# Patient Record
Sex: Male | Born: 1970 | Race: Black or African American | Hispanic: No | Marital: Married | Smoking: Never smoker
Health system: Southern US, Community
[De-identification: ages and names within clinical notes are randomized; demographics above are authoritative.]

## PROBLEM LIST (undated history)

## (undated) DIAGNOSIS — B009 Herpesviral infection, unspecified: Secondary | ICD-10-CM

## (undated) DIAGNOSIS — T7840XA Allergy, unspecified, initial encounter: Secondary | ICD-10-CM

## (undated) DIAGNOSIS — L280 Lichen simplex chronicus: Secondary | ICD-10-CM

## (undated) DIAGNOSIS — G40909 Epilepsy, unspecified, not intractable, without status epilepticus: Secondary | ICD-10-CM

## (undated) HISTORY — DX: Epilepsy, unspecified, not intractable, without status epilepticus: G40.909

## (undated) HISTORY — PX: CYSTECTOMY: SUR359

## (undated) HISTORY — PX: TONSILLECTOMY: SUR1361

## (undated) HISTORY — DX: Herpesviral infection, unspecified: B00.9

## (undated) HISTORY — DX: Allergy, unspecified, initial encounter: T78.40XA

## (undated) HISTORY — DX: Lichen simplex chronicus: L28.0

---

## 1998-04-11 ENCOUNTER — Emergency Department (HOSPITAL_COMMUNITY): Admission: EM | Admit: 1998-04-11 | Discharge: 1998-04-11 | Payer: Self-pay | Admitting: Emergency Medicine

## 2000-03-07 ENCOUNTER — Emergency Department (HOSPITAL_COMMUNITY): Admission: EM | Admit: 2000-03-07 | Discharge: 2000-03-07 | Payer: Self-pay | Admitting: Emergency Medicine

## 2000-07-05 ENCOUNTER — Encounter: Payer: Self-pay | Admitting: Emergency Medicine

## 2000-07-05 ENCOUNTER — Emergency Department (HOSPITAL_COMMUNITY): Admission: EM | Admit: 2000-07-05 | Discharge: 2000-07-05 | Payer: Self-pay | Admitting: *Deleted

## 2000-07-05 ENCOUNTER — Emergency Department (HOSPITAL_COMMUNITY): Admission: EM | Admit: 2000-07-05 | Discharge: 2000-07-05 | Payer: Self-pay | Admitting: Emergency Medicine

## 2009-04-14 ENCOUNTER — Ambulatory Visit: Payer: Self-pay | Admitting: Diagnostic Radiology

## 2009-04-14 ENCOUNTER — Ambulatory Visit (HOSPITAL_BASED_OUTPATIENT_CLINIC_OR_DEPARTMENT_OTHER): Admission: RE | Admit: 2009-04-14 | Discharge: 2009-04-14 | Payer: Self-pay | Admitting: Family Medicine

## 2009-09-07 ENCOUNTER — Ambulatory Visit: Payer: Self-pay | Admitting: Internal Medicine

## 2009-09-07 DIAGNOSIS — A6000 Herpesviral infection of urogenital system, unspecified: Secondary | ICD-10-CM | POA: Insufficient documentation

## 2009-09-07 DIAGNOSIS — R569 Unspecified convulsions: Secondary | ICD-10-CM

## 2009-09-07 LAB — CONVERTED CEMR LAB
Basophils Absolute: 0.1 10*3/uL (ref 0.0–0.1)
Basophils Relative: 1.9 % (ref 0.0–3.0)
Eosinophils Absolute: 0.1 10*3/uL (ref 0.0–0.7)
Eosinophils Relative: 1.9 % (ref 0.0–5.0)
HCT: 41.9 % (ref 39.0–52.0)
Hemoglobin: 14.3 g/dL (ref 13.0–17.0)
Herpes Simplex Vrs I&II-IgM Ab (EIA): 0.85
Lymphocytes Relative: 23.6 % (ref 12.0–46.0)
Lymphs Abs: 1.7 10*3/uL (ref 0.7–4.0)
MCHC: 34.1 g/dL (ref 30.0–36.0)
MCV: 90.4 fL (ref 78.0–100.0)
Monocytes Absolute: 0.5 10*3/uL (ref 0.1–1.0)
Monocytes Relative: 7 % (ref 3.0–12.0)
Neutro Abs: 4.7 10*3/uL (ref 1.4–7.7)
Neutrophils Relative %: 65.6 % (ref 43.0–77.0)
Platelets: 203 10*3/uL (ref 150.0–400.0)
RBC: 4.63 M/uL (ref 4.22–5.81)
RDW: 11.7 % (ref 11.5–14.6)
Sed Rate: 7 mm/hr (ref 0–22)
WBC: 7.1 10*3/uL (ref 4.5–10.5)

## 2009-09-08 ENCOUNTER — Telehealth: Payer: Self-pay | Admitting: Internal Medicine

## 2009-09-08 ENCOUNTER — Ambulatory Visit: Payer: Self-pay | Admitting: Internal Medicine

## 2009-09-22 ENCOUNTER — Ambulatory Visit: Payer: Self-pay | Admitting: Internal Medicine

## 2009-10-13 ENCOUNTER — Ambulatory Visit: Payer: Self-pay | Admitting: Internal Medicine

## 2009-10-23 ENCOUNTER — Encounter: Payer: Self-pay | Admitting: Internal Medicine

## 2010-01-11 ENCOUNTER — Ambulatory Visit: Payer: Self-pay | Admitting: Internal Medicine

## 2010-01-11 ENCOUNTER — Telehealth: Payer: Self-pay | Admitting: Internal Medicine

## 2010-01-11 DIAGNOSIS — E663 Overweight: Secondary | ICD-10-CM | POA: Insufficient documentation

## 2010-02-13 ENCOUNTER — Encounter: Payer: Self-pay | Admitting: Internal Medicine

## 2010-09-05 ENCOUNTER — Encounter: Payer: Self-pay | Admitting: Internal Medicine

## 2010-09-12 IMAGING — CR DG FOOT COMPLETE 3+V*R*
3 series · 3 of 3 positions shown · non-contrast
Comparison: None

CLINICAL DATA: Right great toe pain.  No injury

RIGHT FOOT COMPLETE - 3+ VIEW

[t foot ap right]
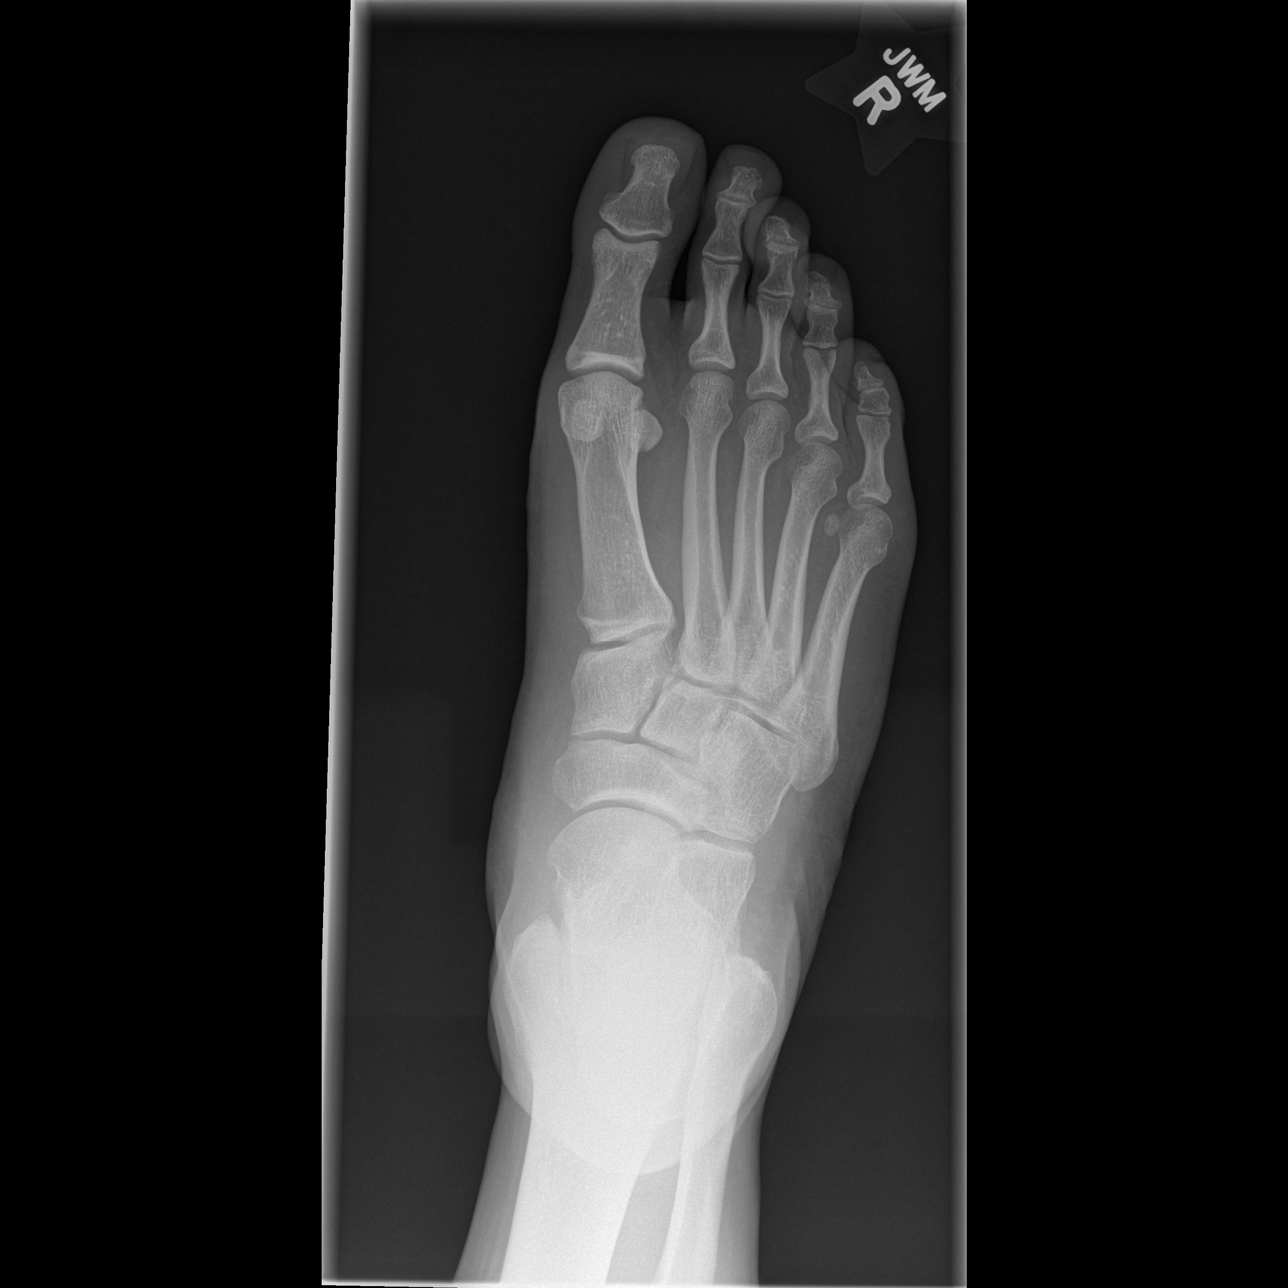

[t foot oblique right]
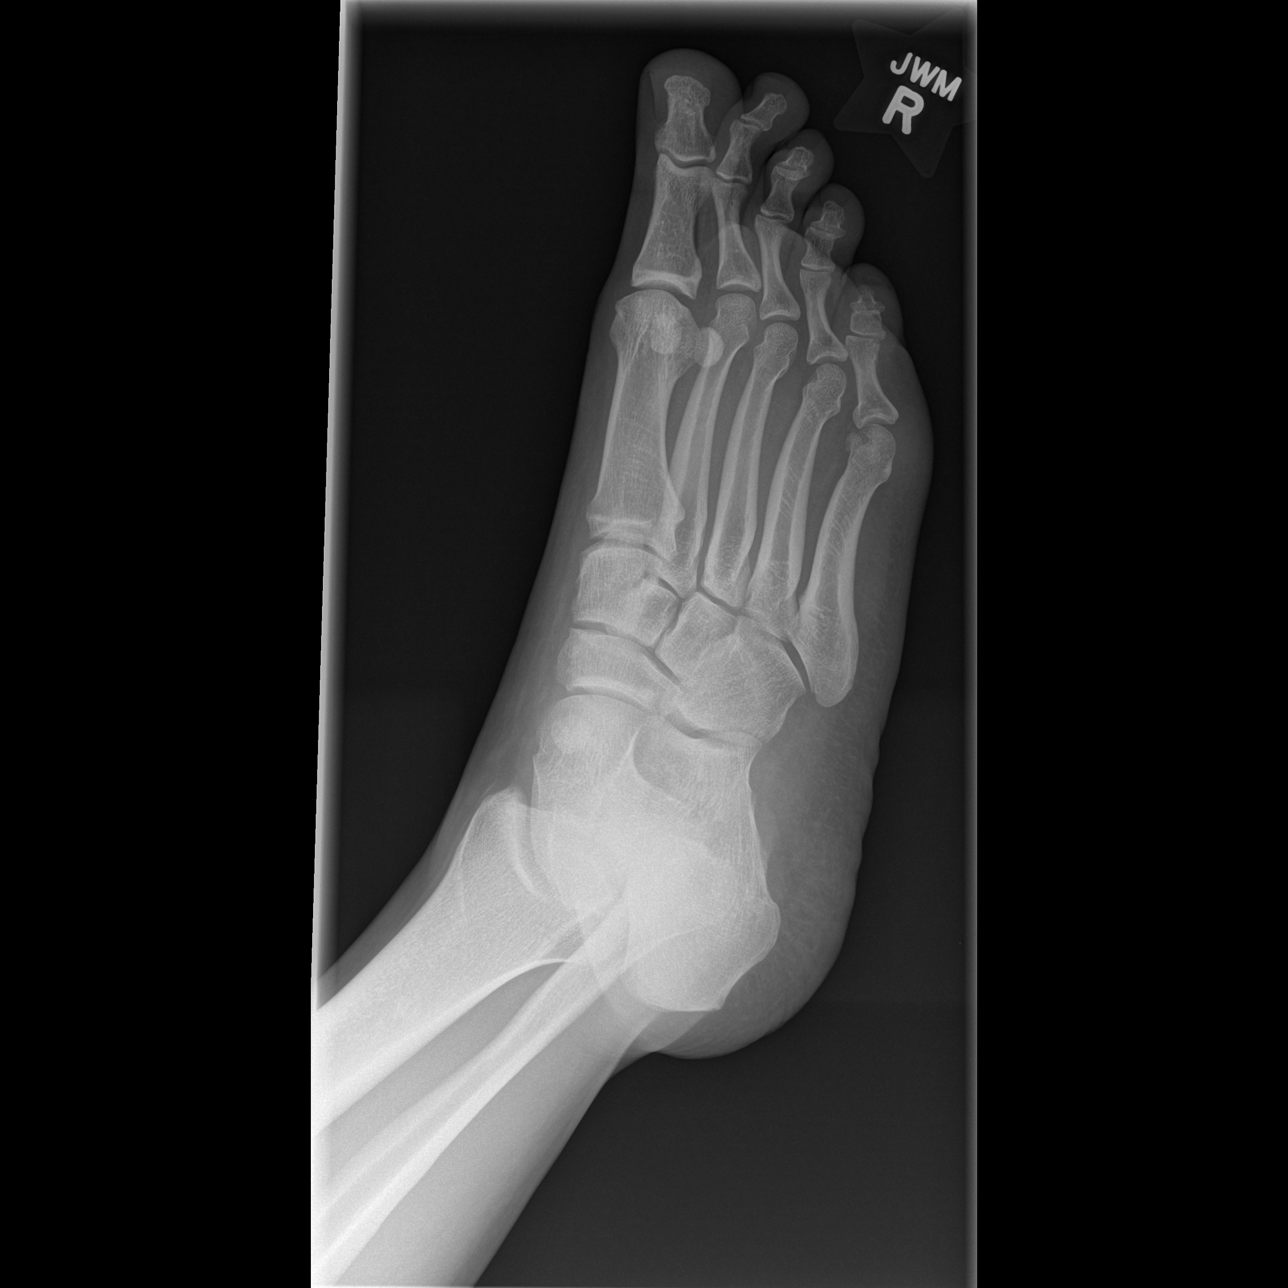

[t foot lat right]
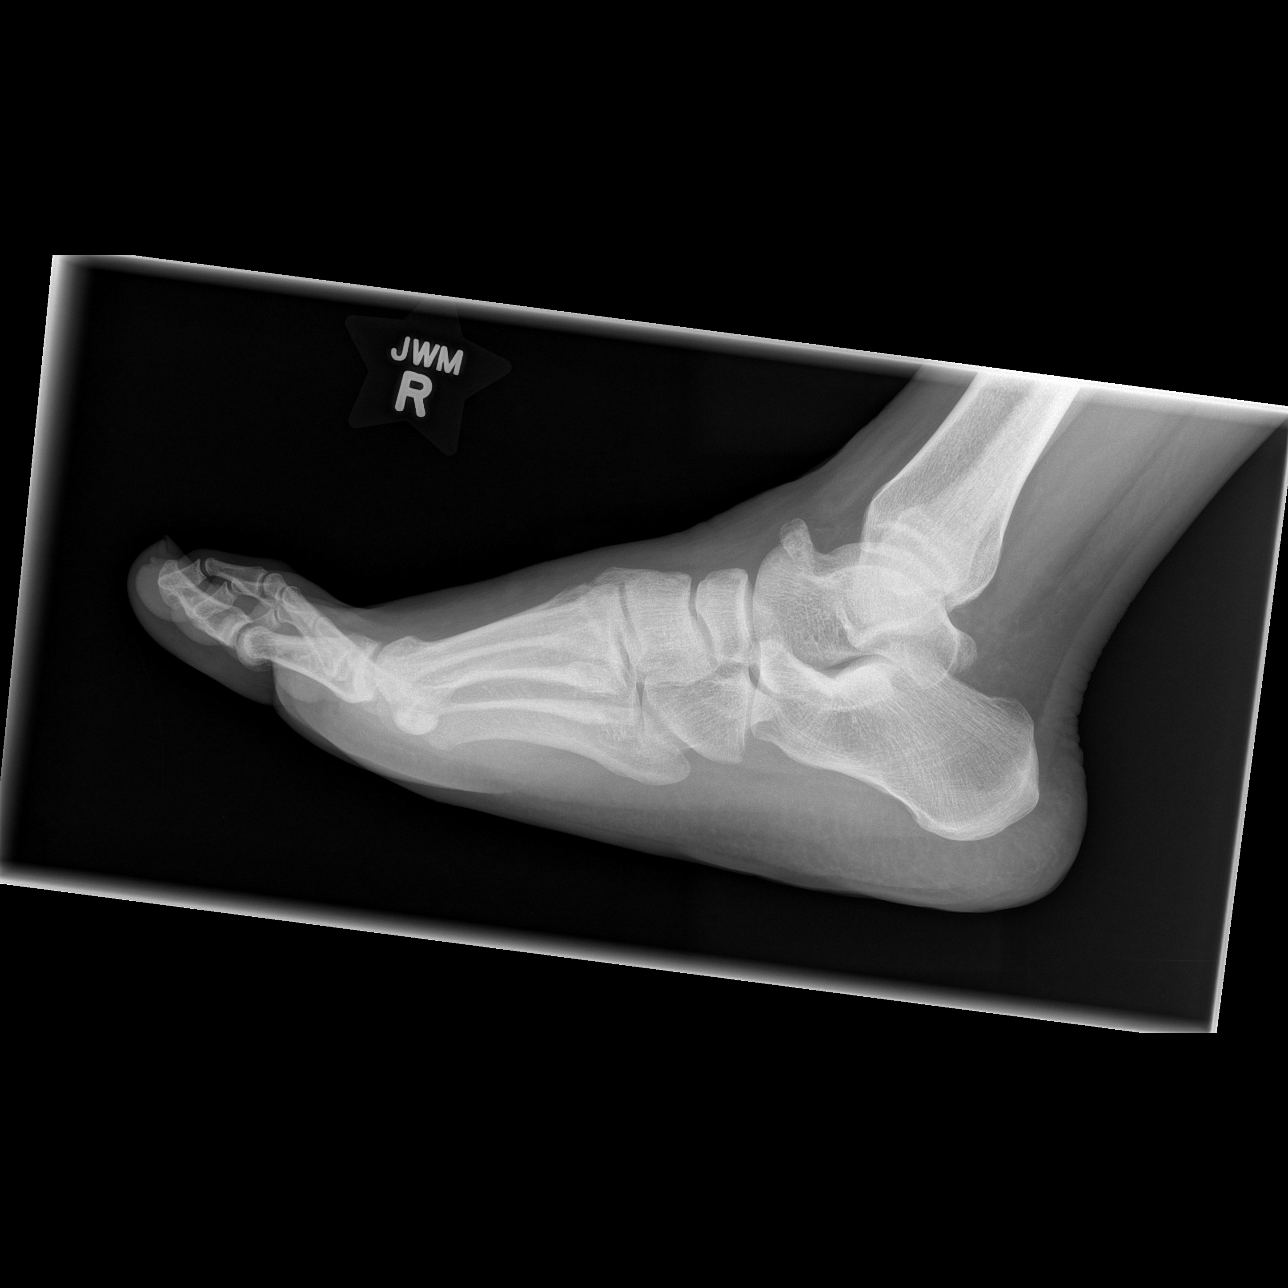

[3 of 3 positions shown; findings below may reference images not displayed]

FINDINGS: No evidence of fracture or dislocation of the first ray.
No soft tissue abnormality.  There is congenital beaking of the
anterior talus.
IMPRESSION: No acute osseous abnormality of the great toe or first ray.

## 2010-12-12 ENCOUNTER — Ambulatory Visit: Payer: Self-pay | Admitting: Internal Medicine

## 2010-12-12 LAB — CONVERTED CEMR LAB
ALT: 28 units/L (ref 0–53)
AST: 43 units/L — ABNORMAL HIGH (ref 0–37)
Albumin: 4.2 g/dL (ref 3.5–5.2)
Alkaline Phosphatase: 29 units/L — ABNORMAL LOW (ref 39–117)
BUN: 35 mg/dL — ABNORMAL HIGH (ref 6–23)
Basophils Absolute: 0 10*3/uL (ref 0.0–0.1)
Basophils Relative: 0.4 % (ref 0.0–3.0)
Bilirubin, Direct: 0.1 mg/dL (ref 0.0–0.3)
CO2: 30 meq/L (ref 19–32)
Calcium: 9.4 mg/dL (ref 8.4–10.5)
Chloride: 102 meq/L (ref 96–112)
Cholesterol: 268 mg/dL — ABNORMAL HIGH (ref 0–200)
Creatinine, Ser: 1.5 mg/dL (ref 0.4–1.5)
Direct LDL: 201.6 mg/dL
Eosinophils Absolute: 0.1 10*3/uL (ref 0.0–0.7)
Eosinophils Relative: 2.2 % (ref 0.0–5.0)
GFR calc non Af Amer: 68.12 mL/min (ref >60.00)
Glucose, Bld: 89 mg/dL (ref 70–99)
HCT: 41.8 % (ref 39.0–52.0)
HDL: 48.8 mg/dL (ref >39.00)
Hemoglobin: 14.2 g/dL (ref 13.0–17.0)
Lymphocytes Relative: 29.9 % (ref 12.0–46.0)
Lymphs Abs: 1.6 10*3/uL (ref 0.7–4.0)
MCHC: 34 g/dL (ref 30.0–36.0)
MCV: 92 fL (ref 78.0–100.0)
Monocytes Absolute: 0.4 10*3/uL (ref 0.1–1.0)
Monocytes Relative: 6.7 % (ref 3.0–12.0)
Neutro Abs: 3.4 10*3/uL (ref 1.4–7.7)
Neutrophils Relative %: 60.8 % (ref 43.0–77.0)
Platelets: 207 10*3/uL (ref 150.0–400.0)
Potassium: 4.7 meq/L (ref 3.5–5.1)
RBC: 4.55 M/uL (ref 4.22–5.81)
RDW: 12.8 % (ref 11.5–14.6)
Sodium: 138 meq/L (ref 135–145)
TSH: 0.49 microintl units/mL (ref 0.35–5.50)
Total Bilirubin: 0.7 mg/dL (ref 0.3–1.2)
Total CHOL/HDL Ratio: 5
Total Protein: 7 g/dL (ref 6.0–8.3)
Triglycerides: 35 mg/dL (ref 0.0–149.0)
VLDL: 7 mg/dL (ref 0.0–40.0)
WBC: 5.5 10*3/uL (ref 4.5–10.5)

## 2011-01-31 NOTE — Assessment & Plan Note (Signed)
Summary: FU Ricky Soto   Vital Signs:  Patient profile:   40 year old male Height:      71 inches Weight:      240 pounds O2 Sat:      98 % on Room air Temp:     98.1 degrees F oral Pulse rate:   57 / minute Pulse rhythm:   regular BP sitting:   130 / 84  (left arm) Cuff size:   large  Vitals Entered By: Rock Nephew CMA (January 11, 2010 9:48 AM)  O2 Flow:  Room air CC: follow-up visit   Primary Care Provider:  Yetta Barre  CC:  follow-up visit.  History of Present Illness: He returns for f/up and is having monthly outbreaks in his rectum with burning and stinging. Anamantle has helped some. He  has used up all of his refills of Acyclovir.  Preventive Screening-Counseling & Management  Alcohol-Tobacco     Alcohol drinks/day: <1     Smoking Status: never  Hep-HIV-STD-Contraception     Hepatitis Risk: no risk noted     HIV Risk: no risk noted     STD Risk: no risk noted      Sexual History:  currently monogamous.        Drug Use:  no.        Blood Transfusions:  no.    Current Medications (verified): 1)  Keppra 500 Mg Tabs (Levetiracetam) .... Take 1 Tablet By Mouth Two Times A Day 2)  Lidocaine-Hydrocortisone Ace 3-2.5 % Kit (Lidocaine-Hydrocortisone Ace)  Allergies (verified): 1)  ! Penicillin  Past History:  Past Medical History: Reviewed history from 09/07/2009 and no changes required. Seizure disorder  Past Surgical History: Reviewed history from 09/07/2009 and no changes required. Tonsillectomy  Family History: Reviewed history from 09/07/2009 and no changes required. Family History of Arthritis Family History Hypertension  Social History: Reviewed history from 09/07/2009 and no changes required. Occupation: Designer, fashion/clothing Married Never Smoked Alcohol use-yes Drug use-no Regular exercise-yes  Review of Systems       The patient complains of weight gain.  The patient denies anorexia, fever, weight loss, chest pain, abdominal pain, suspicious skin  lesions, enlarged lymph nodes, and angioedema.   General:  Denies chills, fatigue, fever, loss of appetite, malaise, sweats, and weakness.  Physical Exam  General:  alert, well-developed, well-nourished, well-hydrated, appropriate dress, normal appearance, healthy-appearing, cooperative to examination, and good hygiene.   Eyes:  No icterus Mouth:  Oral mucosa and oropharynx without lesions or exudates.  Teeth in good repair. Neck:  supple, full ROM, no masses, no thyromegaly, normal carotid upstroke, and no cervical lymphadenopathy.   Lungs:  Normal respiratory effort, chest expands symmetrically. Lungs are clear to auscultation, no crackles or wheezes. Heart:  Normal rate and regular rhythm. S1 and S2 normal without gallop, murmur, click, rub or other extra sounds. Rectal:  normal sphincter tone, no masses, no tenderness, no fissures, no fistulae, no perianal rash, and external hemorrhoid(s).  no more lesions on the mucosa.  Genitalia:  circumcised, no hydrocele, no varicocele, no scrotal masses, no testicular masses or atrophy, no cutaneous lesions, and no urethral discharge.   Msk:  normal ROM, no joint tenderness, no joint swelling, and no joint warmth.   Extremities:  No clubbing, cyanosis, edema, or deformity noted with normal full range of motion of all joints.   Neurologic:  No cranial nerve deficits noted. Station and gait are normal. Plantar reflexes are down-going bilaterally. DTRs are symmetrical throughout. Sensory, motor  and coordinative functions appear intact.   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  Complete Medication List: 1)  Keppra 500 Mg Tabs (Levetiracetam) .... Take 1 tablet by mouth two times a day 2)  Lidocaine-hydrocortisone Ace 3-2.5 % Kit (Lidocaine-hydrocortisone ace) 3)  Acyclovir 400 Mg Tabs (Acyclovir) .... One by mouth two  times a day  Patient Instructions: 1)  Please schedule a follow-up appointment in 4 months. 2)  It is important that you exercise regularly at least 20 minutes 5 times a week. If you develop chest pain, have severe difficulty breathing, or feel very tired , stop exercising immediately and seek medical attention. 3)  You need to lose weight. Consider a lower calorie diet and regular exercise.  Prescriptions: ACYCLOVIR 400 MG TABS (ACYCLOVIR) One by mouth two times a day  #60 x 11   Entered and Authorized by:   Etta Grandchild MD   Signed by:   Etta Grandchild MD on 01/11/2010   Method used:   Electronically to        [facility] [phone]* (retail)       [address]       [place], [place]  [address]       Ph: [phone]       Fax: [phone]   RxID:   [id number]

## 2011-01-31 NOTE — Letter (Signed)
Summary: [facility]   Imported By: Lester Humeston 10/01/2010 09:13:46  _____________________________________________________________________  External Attachment:    Type:   Image     Comment:   External Document

## 2011-01-31 NOTE — Assessment & Plan Note (Signed)
Summary: CPX/[employer]/will come fasting/cd   Vital Signs:  Patient profile:   40 year old male Height:      71 inches Weight:      234 pounds BMI:     32.75 O2 Sat:      96 % on Room air Temp:     97.6 degrees F oral Pulse rate:   60 / minute Pulse rhythm:   regular Resp:     16 per minute BP sitting:   106 / 70  (left arm) Cuff size:   large  Vitals Entered By: Rock Nephew CMA (December 12, 2010 8:11 AM)  Nutrition Counseling: Patient's BMI is greater than 25 and therefore counseled on weight management options.  O2 Flow:  Room air CC: Pt here for CPX w/labs Is Patient Diabetic? No Pain Assessment Patient in pain? no       Does patient need assistance? Functional Status Self care Ambulation Normal   Primary Care Provider:  Yetta Barre  CC:  Pt here for CPX w/labs.  History of Present Illness: He returns for a complete physical but also asks that his dose of Acyclovir be increased. He still has an occasional outbreak in his anal area and is going to have a biopsy done by Dr. Andrey Campanile whenever he can schedule it. He has patches of dry skin over his penis tip and on his buttocks.  Current Medications (verified): 1)  Keppra 500 Mg Tabs (Levetiracetam) .... Take 1 Tablet By Mouth Two Times A Day 2)  Acyclovir 400 Mg Tabs (Acyclovir) .... One By Mouth Two Times A Day  Allergies (verified): 1)  ! 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 Family History of Arthritis Family History Hypertension  Social History: Reviewed history from 09/07/2009 and no changes required. 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   General:  Denies chills, fatigue, fever, loss of appetite, malaise, sleep disorder, sweats, weakness, and weight loss. GU:  Denies decreased libido, discharge, dysuria, genital sores, hematuria, incontinence, nocturia, urinary frequency, and urinary hesitancy. Neuro:  Denies brief paralysis, difficulty with concentration, disturbances in coordination, headaches, memory loss, numbness, poor balance, seizures, sensation of room spinning, tingling, tremors, visual disturbances, and weakness.  Physical Exam  General:  alert, well-developed, well-nourished, well-hydrated, appropriate dress, normal appearance, healthy-appearing, cooperative to examination, good hygiene, and overweight-appearing.   Head:  normocephalic, atraumatic, no abnormalities observed, and no abnormalities palpated.   Eyes:  vision grossly intact, pupils equal, and no injection.   Ears:  External ear exam shows no significant lesions or deformities.  Otoscopic examination reveals clear canals, tympanic membranes are intact bilaterally without bulging, retraction, inflammation or discharge. Hearing is grossly normal bilaterally. Nose:  External nasal examination shows no deformity or inflammation. Nasal mucosa are pink and moist without lesions or exudates.  Mouth:  Oral mucosa and oropharynx without lesions or exudates.  Teeth in good  repair. Neck:  supple, full ROM, no masses, no thyromegaly, no thyroid nodules or tenderness, no JVD, normal carotid upstroke, no carotid bruits, no cervical lymphadenopathy, and no neck tenderness.   Lungs:  normal respiratory effort, no intercostal retractions, no accessory muscle use, normal breath sounds, no dullness, no fremitus, no crackles, and no wheezes.   Heart:  normal rate, regular rhythm, no murmur, no gallop, no rub, and no JVD.   Abdomen:  soft, non-tender, normal bowel sounds, no distention, no masses, no guarding, no rigidity, no rebound tenderness, no abdominal hernia, no inguinal hernia, no hepatomegaly, and no splenomegaly.   Rectal:  normal sphincter tone, no masses, no tenderness, no fissures, no fistulae, no perianal rash, and external hemorrhoid(s).  no more lesions on the mucosa.  Genitalia:  circumcised, no hydrocele, no varicocele, no scrotal masses, no testicular masses or atrophy, and no urethral discharge.   Prostate:  Prostate gland firm and smooth, no enlargement, nodularity, tenderness, mass, asymmetry or induration. Msk:  normal ROM, no joint tenderness, no joint swelling, and no joint warmth.   Pulses:  R and L carotid,radial,femoral,dorsalis pedis and posterior tibial pulses are full and equal bilaterally Extremities:  No clubbing, cyanosis, edema, or deformity noted with normal full range of motion of all joints.   Neurologic:  No cranial nerve deficits noted. Station and gait are normal. Plantar reflexes are down-going bilaterally. DTRs are symmetrical throughout. Sensory, motor and coordinative functions appear intact. Skin:  he has areas of xerosis on his glans and buttocks with scaling but there are no vesicles, excoriations, erythema, exudate, induration, ttp, streaking, or fissuring. Cervical Nodes:  no anterior cervical adenopathy and no posterior cervical adenopathy.   Axillary Nodes:  no R axillary adenopathy and no L axillary adenopathy.   Inguinal Nodes:   no R inguinal adenopathy and no L inguinal adenopathy.   Psych:  Oriented X3, memory intact for recent and remote, normally interactive, good eye contact, not anxious appearing, not depressed appearing, not agitated, not suicidal, and not homicidal.     Impression & Recommendations:  Problem # 1:  ROUTINE GENERAL MEDICAL EXAM@HEALTH  CARE FACL (ICD-V70.0) Assessment New  Td Booster: given (12/30/2008)    Discussed using sunscreen, use of alcohol, drug use, self testicular exam, routine dental care, routine eye care, routine physical exam, seat belts, multiple vitamins, and recommendations for immunizations.  Discussed exercise and checking cholesterol.  Discussed gun safety, safe sex, and contraception.   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 Take 1 tablet by mouth two times a day  Orders: Venipuncture ([address]) TLB-Lipid Panel (80061-LIPID) TLB-BMP (Basic Metabolic Panel-BMET) (80048-METABOL) TLB-CBC Platelet - w/Differential (85025-CBCD) TLB-Hepatic/Liver Function Pnl (80076-HEPATIC) TLB-TSH (Thyroid Stimulating Hormone) (84443-TSH)  Complete Medication List: 1)  Keppra 500 Mg Tabs (Levetiracetam) .... Take 1 tablet by mouth two times a day 2)  Acyclovir 800 Mg Tabs (Acyclovir) .... One by mouth two times a day  Patient Instructions: 1)  Please schedule a follow-up appointment in 2 months. 2)  It  is important that you exercise regularly  at least 20 minutes 5 times a week. If you develop chest pain, have severe difficulty breathing, or feel very tired , stop exercising immediately and seek medical attention. 3)  You need to lose weight. Consider a lower calorie diet and regular exercise.  4)  If you could be exposed to sexually transmitted diseases, you should use a condom. Prescriptions: ACYCLOVIR 800 MG TABS (ACYCLOVIR) One by mouth two times a day  #60 x 11   Entered and Authorized by:   Etta Grandchild MD   Signed by:   Etta Grandchild MD on 12/12/2010   Method used:   Electronically to        [id number] (retail)       [address]       [place], [place]  [address]       Ph: [phone] or [phone]       Fax: [phone]   RxID:   [id number] ACYCLOVIR 800 MG TABS (ACYCLOVIR) One by mouth two times a day  #60 x 11   Entered and Authorized by:   Etta Grandchild MD   Signed by:   Etta Grandchild MD on 12/12/2010   Method used:   Electronically to        [facility] [phone]* (retail)       [address]       [place], [place]  [address]       Ph: [phone]       Fax: [phone]   RxID:   [id number]    Orders Added: 1)  Venipuncture [93235] 2)  TLB-Lipid Panel [80061-LIPID] 3)  TLB-BMP (Basic Metabolic Panel-BMET) [80048-METABOL] 4)  TLB-CBC Platelet - w/Differential [85025-CBCD] 5)  TLB-Hepatic/Liver Function Pnl [80076-HEPATIC] 6)  TLB-TSH (Thyroid Stimulating Hormone) [84443-TSH] 7)  Est. Patient 18-39 years [[address]] 8)  Est. Patient Level IV [[address]]

## 2011-01-31 NOTE — Letter (Signed)
Summary: [facility]   Imported By: Sherian Rein 03/12/2010 09:43:07  _____________________________________________________________________  External Attachment:    Type:   Image     Comment:   External Document

## 2011-01-31 NOTE — Progress Notes (Signed)
  Phone Note Outgoing Call   Summary of Call: done Initial call taken by: Etta Grandchild MD,  January 11, 2010 10:13 AM  New Problems: OVERWEIGHT (ICD-278.02)   New Problems: OVERWEIGHT (ICD-278.02)

## 2011-01-31 NOTE — Letter (Signed)
Summary: Lipid Letter  [facility]  [address] [place], [place] [address]   Phone: [phone]  Fax: [phone]    12/12/2010  Ricky Soto [address] [place], [place]  [address]  Dear Ricky Soto:  We have carefully reviewed your last lipid profile from  and the results are noted below with a summary of recommendations for lipid management.    Cholesterol:       268     Goal: <200 wow, high   HDL "good" Cholesterol:   57.84     Goal: >40   LDL "bad" Cholesterol:   202     Goal: <130 wow, high   Triglycerides:       35.0     Goal: <150        TLC Diet (Therapeutic Lifestyle Change): Saturated Fats & Transfatty acids should be kept < 7% of total calories ***Reduce Saturated Fats Polyunstaurated Fat can be up to 10% of total calories Monounsaturated Fat Fat can be up to 20% of total calories Total Fat should be no greater than 25-35% of total calories Carbohydrates should be 50-60% of total calories Protein should be approximately 15% of total calories Fiber should be at least 20-30 grams a day ***Increased fiber may help lower LDL Total Cholesterol should be < 200mg /day Consider adding plant stanol/sterols to diet (example: Benacol spread) ***A higher intake of unsaturated fat may reduce Triglycerides and Increase HDL    Adjunctive Measures (may lower LIPIDS and reduce risk of Heart Attack) include: Aerobic Exercise (20-30 minutes 3-4 times a week) Limit Alcohol Consumption Weight Reduction Aspirin 75-81 mg a day by mouth (if not allergic or contraindicated) Dietary Fiber 20-30 grams a day by mouth     Current Medications: 1)    Keppra 500 Mg Tabs (Levetiracetam) .... Take 1 tablet by mouth two times a day 2)    Acyclovir 800 Mg Tabs (Acyclovir) .... One by mouth two times a day  If you have any questions, please call. We appreciate being able to work with you.   Sincerely,    [facility] Ricky Grandchild MD

## 2011-01-31 NOTE — Letter (Signed)
Summary: Results Follow-up Letter  [facility]  [address] [place], [place] [address]   Phone: [phone]  Fax: [phone]    12/12/2010  Erskin Burnet [address] [place], [place]  [address]  Dear Mr. [facility],   The following are the results of your recent test(s):  Test     Result     Kidney     mild dehydration and dysfunction Liver       slight enzyme elevation CBC       normal Thyroid     normal   _________________________________________________________  Please call for an appointment soon _________________________________________________________ _________________________________________________________ _________________________________________________________  Sincerely,  Sanda Linger MD [facility]

## 2011-07-12 ENCOUNTER — Ambulatory Visit (INDEPENDENT_AMBULATORY_CARE_PROVIDER_SITE_OTHER): Payer: Self-pay | Admitting: General Surgery

## 2011-07-12 ENCOUNTER — Encounter (INDEPENDENT_AMBULATORY_CARE_PROVIDER_SITE_OTHER): Payer: Self-pay | Admitting: General Surgery

## 2011-07-12 ENCOUNTER — Ambulatory Visit (INDEPENDENT_AMBULATORY_CARE_PROVIDER_SITE_OTHER): Payer: Private Health Insurance - Indemnity | Admitting: General Surgery

## 2011-07-12 VITALS — BP 160/108 | HR 64 | Temp 96.0°F | Ht 71.0 in | Wt 246.6 lb

## 2011-07-12 DIAGNOSIS — L290 Pruritus ani: Secondary | ICD-10-CM

## 2011-07-12 NOTE — Patient Instructions (Signed)
Anal Itching   Itching around the anus is a common problem. It is usually a non-dangerous (benign) but bothersome condition. It often is caused by skin irritation from stool, moisture, soaps, or clothing. Other causes are pinworms, especially if the itching is worse at night. In adults, the itching may be due to hemorrhoids. In some cases, the cause is unknown. Itching usually can be controlled by keeping the anal area clean and dry.   CAUSES   Loose or sticky stool from diarrhea or rectal leakage (fecal soilage).   Hemorrhoids. They allow stool to stick to the rectal area.   Certain foods. Be sure to discuss your diet with your caregiver.   Dry skin or skin diseases can occur at the anus.   Infections such as a local yeast infection or certain sexually transmitted diseases (STD's).   Worms (parasites).   Diseases of the anus. These include abscesses, fissures, fistulas or cancer.   Sometimes a cause cannot be found.   DIAGNOSIS   Your caregiver will take your history and examine you. A careful exam of the anus is important. Your caregiver will inspect the outer area of your anus and will do a rectal exam (feeling inside with a gloved finger).   Sometimes your caregiver will need to look inside the anus. This is a simple procedure that may be a little uncomfortable but usually does not require anesthesia.   If abnormalities are found, then a biopsy might be done or you may be referred to a specialist.   TREATMENT   The treatment of your condition will depend on the cause.   Your caregiver will advise you on treatment of any disease found.   If you have rectal leakage or loose stools, a diet high in fiber or a fiber supplement should improve your condition.   You should avoid foods or substances that might be causing your itching.   Gentle care of your anal area is important to avoid worsening the irritation.   HOME CARE AND PREVENTIVE MEASURES:   Do not rub or scratch the area. This makes the itching worse. It could  worsen conditions such as parasite infections.   After every bowel movement and at bedtime, gently clean the anal area. Bathe or use moistened tissue or soft wash cloth. You also may use pre-moistened anal cleansing pads or tissues made for cleaning up babies. Do not use soap. Gently pat the area dry.   Wear underwear made of cotton or with a cotton crotch. Do not wear tight fitting clothes or underwear that keeps moisture in.   Avoid foods and beverages that may cause anal itching. Examples are beer, tea, coffee, milk, cola, tomatoes, citrus fruits, nuts, chocolate and spicy foods.   Be sure you have enough fiber in your diet.   Do not use products that may irritate the anal skin. These include perfumed or colored toilet paper, deodorant sprays and perfumed soaps.   Do not use any medication on the anal area unless advised. Some products may make itching worse.   It may take a few weeks for things to fully improve.   SEEK MEDICAL CARE IF:   The itching is not better in 3 to 4 days or is getting worse.   The skin around the anus becomes red or tender. This may be a sign of infection.   You have pain in the anus, especially with bowel movement.   SEEK IMMEDIATE CARE IF:   You have increasing pain in   the anus or in the abdomen.   You have blood coming from the anus.   You have pus or other discharge from the anus.   You develop a temperature.   Document Released: 12/13/2000 Document Re-Released: 03/12/2010   Exit[employer]® Patient Information ©2011 [employer].

## 2011-07-12 NOTE — Progress Notes (Signed)
Subjective:     Patient ID: Ricky Soto, male   DOB: January 01, 1971, 40 y.o.   MRN: [id number]  HPI  40 year old African American male comes in today for long-term followup for pruritus ani. I last saw him on September 05, 2010. I initially saw him in October 2010. At his last visit I recommended a perianal skin biopsy but the patient declined. He states that he did quite well for some time. He states that he recently had another "breakout". This happened around 2 months ago. He reports daily bowel movements. He denies any weight loss or melena or hematochezia. He denies any specific perianal or rectal pain. He describes it as a sensation that the skin is raw around his anus. He will have itching after he has a bowel movement. He denies any incontinence. He does have a history of genital herpes and is on chronic acyclovir. He denies any new STDs. He denies any new genital lesions. He is currently not applying anything to his perianal skin.  Past Medical History  Diagnosis Date  . Epilepsy   . Allergy   . Herpes   . Pruritus ani     Past Surgical History  Procedure Date  . Cystectomy   . Tonsillectomy     Family History  Problem Relation Age of Onset  . Hypertension Mother     History  Substance Use Topics  . Smoking status: Never Smoker   . Smokeless tobacco: Not on file  . Alcohol Use: Yes   Allergies  Allergen Reactions  . Penicillins     REACTION: childhood reaction- high fever   Current Outpatient Prescriptions  Medication Sig Dispense Refill  . acyclovir (ZOVIRAX) 800 MG tablet 2 (two) times daily.       Marland Kitchen KEPPRA 500 MG tablet 2 (two) times daily.       . mometasone (NASONEX) 50 MCG/ACT nasal spray Place 2 sprays into the nose as needed.           Review of Systems  Constitutional: Negative.   HENT: Negative.   Respiratory: Negative.   Cardiovascular: Negative.   Gastrointestinal: Positive for rectal pain (skin feels raw; itching). Negative for abdominal pain,  diarrhea, constipation, abdominal distention and anal bleeding.  Genitourinary: Negative.   Musculoskeletal: Negative.   Skin: Negative.   Neurological: Negative.   Hematological: Negative.   Psychiatric/Behavioral: Negative.        Objective:   Physical Exam  Vitals reviewed. Constitutional: He is oriented to person, place, and time. He appears well-developed and well-nourished. No distress.  HENT:  Head: Normocephalic and atraumatic.  Eyes: Conjunctivae are normal. Pupils are equal, round, and reactive to light.  Neck: Normal range of motion. Neck supple. No tracheal deviation present.  Cardiovascular: Normal rate, regular rhythm and normal heart sounds.   Pulmonary/Chest: Effort normal and breath sounds normal. He has no wheezes.  Abdominal: Soft. Bowel sounds are normal. He exhibits no distension. There is no tenderness.  Genitourinary: Penis normal. Rectal exam shows no external hemorrhoid, no internal hemorrhoid and anal tone normal.     Musculoskeletal: Normal range of motion.  Lymphadenopathy:       Right: No inguinal adenopathy present.       Left: No inguinal adenopathy present.  Neurological: He is alert and oriented to person, place, and time.  Skin: Skin is warm and dry.  Psychiatric: He has a normal mood and affect. His behavior is normal.   Data reviewed: I reviewed my office note from  September 05, 2010 as well as from 2010.    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    Plan:     The perianal skin looks a little bit improved since I last saw him in 2011. However I want to rule out any secondary infection since this has been an ongoing problem. It is possible this may be perianal herpes or some other process. I have recommended a punch biopsy of the perianal skin to the patient. He is currently in agreement. We will schedule this on a Wednesday at his request in the office.  In the interim we rediscussed treatment for pruritus  ani. He was given Agricultural engineer. I encouraged him to eliminate scratching, avoid harsh soaps, not to scrub the area with toilet paper, etc. I will hold off on giving him a topical steroid at this time since her planning to do a skin biopsy in a week and a half.

## 2011-07-24 ENCOUNTER — Encounter (INDEPENDENT_AMBULATORY_CARE_PROVIDER_SITE_OTHER): Payer: Private Health Insurance - Indemnity

## 2011-07-24 ENCOUNTER — Encounter (INDEPENDENT_AMBULATORY_CARE_PROVIDER_SITE_OTHER): Payer: Self-pay | Admitting: General Surgery

## 2011-07-24 ENCOUNTER — Ambulatory Visit (INDEPENDENT_AMBULATORY_CARE_PROVIDER_SITE_OTHER): Payer: Private Health Insurance - Indemnity | Admitting: General Surgery

## 2011-07-24 ENCOUNTER — Other Ambulatory Visit (INDEPENDENT_AMBULATORY_CARE_PROVIDER_SITE_OTHER): Payer: Self-pay | Admitting: General Surgery

## 2011-07-24 DIAGNOSIS — L819 Disorder of pigmentation, unspecified: Secondary | ICD-10-CM

## 2011-07-24 MED ORDER — TRAMADOL HCL 50 MG PO TABS: 50.0000 mg | ORAL_TABLET | Freq: Four times a day (QID) | ORAL | Status: DC | PRN

## 2011-07-24 NOTE — Progress Notes (Signed)
Procedure: Left perianal skin punch biopsy x2 -July 24, 2011  History of Present Ilness: 40 year old [place] American male comes in today for perianal skin biopsy. I last saw him about a week and a half ago. He has had chronic perianal itching and burning for over a year and a half now. He does have herpes and is on acyclovir chronically. Since he was last seen he states that his skin has has flared up. He states it has been itching and burning. He denies any fevers or chills. He states he has had some clear drainage as well from around the area. He states that the [employer] has been helpful.  Physical Exam: Well-developed well-nourished overweight African American male in no apparent distress Rectal-he has the chronic hypopigmented area around his anal verge extending for about 2 cm in a circumferential manner. However today he also has scattered areas of hypopigmentation in his medial gluteal cleft. This is circumferential in nature. These areas are mainly circular in nature and he had about half a centimeter in size. Some are slightly raised.  Procedure: After obtaining consent, his perineum was prepped with Betadine. I then infiltrated 6 cc of 1% lidocaine mixed with bicarbonate and epi in the left lateral perianal skin. I then performed (2) 5 mm punch biopsies of the left posterior lateral perianal skin. I then closed the skin defect with 2 interrupted 4-0 chromic sutures. Hemostasis was achieved. The patient tolerated the procedure well. There were no immediate complications.  Assessment and Plan: Status post perianal skin punch biopsy. We will send the skin biopsies to pathology for analysis. He will follow up in one week. He was given wound care instructions. This is the first time I have seen his skin where it is flared up.  We will await pathology results.

## 2011-07-24 NOTE — Patient Instructions (Signed)
Biopsy Information A biopsy is the removal of tissue from the body. The tissue is looked at under a microscope to see what is wrong. Tissue can be removed in several different ways. A surgical biopsy means a piece of tissue is removed by minor surgery. Stitches may be used to close the skin. Special needles and other instruments can also be used to get small pieces of tissue.  Most biopsy specimens are examined by specialists. This is done after the tissue is specially prepared. Small slices are then placed on slides and looked at under a microscope. Most of the time, this process takes 3 days to complete the tissue preparation and exam. Pathologists (the specialist) are usually able to give a diagnosis after looking at the slides. Call your caregiver if you have questions about your biopsy procedure or the results. Make sure you know when and how you are supposed to get your results. Do not assume everything is fine if you do not hear from your caregiver. Document Released: 12/16/2005 Document Re-Released: 06/29/2007 [facility] Patient Information 2011 [place], [place].  Biopsy Procedure Care After Refer to this sheet in the next few weeks. These discharge instructions provide you with general information on caring for yourself after you leave the hospital. Your caregiver may also give you specific instructions. Your treatment has been planned according to the most current medical practices available, but unavoidable complications sometimes occur. If you have any problems or questions after discharge, please call your caregiver. HOME CARE INSTRUCTIONS  You may resume normal diet and activities as directed.   Change bandages (dressings) as directed.   Only take over-the-counter or prescription medicines for pain, discomfort, or fever as directed by your caregiver.  FINDING OUT THE RESULTS OF YOUR TEST Not all test results are available during your visit. If your test results are not back during the  visit, make an appointment with your caregiver to find out the results. Do not assume everything is normal if you have not heard from your caregiver or the medical facility. It is important for you to follow up on all of your test results. SEEK MEDICAL CARE IF:  There is increased bleeding (more than a small spot) from the biopsy site(s).   You notice redness, swelling, or increasing pain in the wound.   Pus is coming from the wound.   You or your child has an oral temperature above101   You notice a bad smell coming from the wound or dressing.  SEEK IMMEDIATE MEDICAL CARE IF:  You develop a rash.   You have difficulty breathing.   You develop any reaction or side effects to medicines taken.  MAKE SURE YOU:  Understand these instructions.   Will watch your condition.   Will get help right away if you are not doing well or get worse.  Document Released: 07/05/2005 Document Re-Released: 06/05/2010 [facility] Patient Information 2011 [place], [place].

## 2011-08-01 ENCOUNTER — Encounter (INDEPENDENT_AMBULATORY_CARE_PROVIDER_SITE_OTHER): Payer: Private Health Insurance - Indemnity | Admitting: General Surgery

## 2011-08-09 ENCOUNTER — Encounter (INDEPENDENT_AMBULATORY_CARE_PROVIDER_SITE_OTHER): Payer: Self-pay | Admitting: General Surgery

## 2011-08-09 ENCOUNTER — Ambulatory Visit (INDEPENDENT_AMBULATORY_CARE_PROVIDER_SITE_OTHER): Payer: Private Health Insurance - Indemnity | Admitting: General Surgery

## 2011-08-09 DIAGNOSIS — L280 Lichen simplex chronicus: Secondary | ICD-10-CM | POA: Insufficient documentation

## 2011-08-09 DIAGNOSIS — L259 Unspecified contact dermatitis, unspecified cause: Secondary | ICD-10-CM

## 2011-08-09 MED ORDER — CLOBETASOL PROPIONATE 0.05 % EX CREA: TOPICAL_CREAM | Freq: Two times a day (BID) | CUTANEOUS | Status: AC

## 2011-08-09 NOTE — Patient Instructions (Signed)
Lichen Planus Lichen planus is a skin problem that causes redness, itching, swelling and sores. Some common areas affected are:  The vulva and vagina.   The gums and inside of the mouth.   The skin of the arms, legs, chest, back and belly.   The fingernails or toenails.   The anus The cause is not known. It could be an autoimmune illness or an allergy. An autoimmune illness is one where your body attacks itself. Lichen planus is not passed from one person to another. It can last for a long time. SIGNS OF LICHEN PLANUS:   There may be a reddish or purplish rash on the skin.   There may be redness or white patches on the gums or tongue.   The nails may become thin or rough or have ridges in them.  HOW WILL MY DOCTOR CHECK FOR LICHEN PLANUS? Your doctor will look for skin changes, changes inside your mouth, or vaginal discharge. Sometimes, a biopsy or small sample of skin may be sent for testing. TREATMENT  Keep the vaginal area as clean and dry as possible.   Your doctor may order a special cream to be put on the sores.   You may be given medicine to take by mouth.   You may be treated by exposure to ultraviolet light.   Sores in the mouth may be treated with special lozenges that are sucked on like a cough drop.   If the vagina becomes too tight, you may be taught how to use a dilator to keep it open.  Document Released: 11/28/2008  [facility] Patient Information 2011 [place], [place].

## 2011-08-10 NOTE — Progress Notes (Signed)
Procedure: Left perianal skin punch biopsy x2 on July 24, 2011  History of Present Ilness: 40 year old [place] American male comes in today after undergoing the above mentioned procedure for chronic perianal skin changes associated with itching and burning. Since he was last seen he states that he's been doing well. He denies any diarrhea or constipation. He denies any fevers or chills. He denies any dysuria. He did have several days of blood-tinged drainage but that has stopped. He states that the itching is not as bad as it was.  Physical Exam: Well-developed well-nourished overweight African American male in no apparent distress Rectal-well healed left perianal skin punch biopsy site. No hematoma or signs of infection. He still has the chronic skin changes around his perineum mainly consisting of areas of hypo-pigmentation. Very little to no erosion of the skin. Some accentuation of the folds.  Pathology: Perianal skin biopsies showed lichenoid dermatitis. The specimen revealed a bandlike inflammatory infiltrate with some epidermal hyperplasia likely secondary to irritation. The differential diagnosis includes lichen planus and early lichen sclerosis.  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  There is no signs of herpes on the skin biopsy. We discussed the pathology report. I placed him on a high potency topical steroid cream called Temovate. I gave him a six-week supply. I also referred him to dermatology to also evaluate the perianal skin changes.  I'll see him on a as needed basis.  Mary Sella. Andrey Campanile, MD

## 2011-12-11 ENCOUNTER — Encounter: Payer: Self-pay | Admitting: Internal Medicine

## 2011-12-11 ENCOUNTER — Other Ambulatory Visit (INDEPENDENT_AMBULATORY_CARE_PROVIDER_SITE_OTHER): Payer: Managed Care, Other (non HMO)

## 2011-12-11 ENCOUNTER — Other Ambulatory Visit: Payer: Self-pay | Admitting: Internal Medicine

## 2011-12-11 ENCOUNTER — Ambulatory Visit (INDEPENDENT_AMBULATORY_CARE_PROVIDER_SITE_OTHER): Payer: Managed Care, Other (non HMO) | Admitting: Internal Medicine

## 2011-12-11 VITALS — BP 130/84 | HR 57 | Temp 98.3°F | Resp 16 | Wt 245.0 lb

## 2011-12-11 DIAGNOSIS — Z0000 Encounter for general adult medical examination without abnormal findings: Secondary | ICD-10-CM | POA: Insufficient documentation

## 2011-12-11 DIAGNOSIS — L280 Lichen simplex chronicus: Secondary | ICD-10-CM

## 2011-12-11 DIAGNOSIS — R569 Unspecified convulsions: Secondary | ICD-10-CM

## 2011-12-11 LAB — CBC WITH DIFFERENTIAL/PLATELET
Basophils Absolute: 0 10*3/uL (ref 0.0–0.1)
Eosinophils Absolute: 0.1 10*3/uL (ref 0.0–0.7)
HCT: 42.5 % (ref 39.0–52.0)
Hemoglobin: 14.4 g/dL (ref 13.0–17.0)
Lymphs Abs: 1.6 10*3/uL (ref 0.7–4.0)
MCHC: 34 g/dL (ref 30.0–36.0)
MCV: 92.2 fl (ref 78.0–100.0)
Monocytes Absolute: 0.4 10*3/uL (ref 0.1–1.0)
Monocytes Relative: 7.3 % (ref 3.0–12.0)
Neutro Abs: 3.3 10*3/uL (ref 1.4–7.7)
Platelets: 221 10*3/uL (ref 150.0–400.0)
RDW: 12.4 % (ref 11.5–14.6)

## 2011-12-11 LAB — LIPID PANEL
Cholesterol: 233 mg/dL — ABNORMAL HIGH (ref 0–200)
Total CHOL/HDL Ratio: 5
VLDL: 13 mg/dL (ref 0.0–40.0)

## 2011-12-11 LAB — URINALYSIS, ROUTINE W REFLEX MICROSCOPIC
Bilirubin Urine: NEGATIVE
Hgb urine dipstick: NEGATIVE
Nitrite: NEGATIVE
Total Protein, Urine: NEGATIVE
Urobilinogen, UA: 0.2 (ref 0.0–1.0)

## 2011-12-11 LAB — TSH: TSH: 0.48 u[IU]/mL (ref 0.35–5.50)

## 2011-12-11 LAB — COMPREHENSIVE METABOLIC PANEL
Albumin: 4.6 g/dL (ref 3.5–5.2)
BUN: 18 mg/dL (ref 6–23)
CO2: 29 mEq/L (ref 19–32)
Calcium: 9.5 mg/dL (ref 8.4–10.5)
Chloride: 103 mEq/L (ref 96–112)
GFR: 80.01 mL/min (ref >60.00)
Glucose, Bld: 89 mg/dL (ref 70–99)
Potassium: 4.3 mEq/L (ref 3.5–5.1)

## 2011-12-11 NOTE — Patient Instructions (Signed)
Health Maintenance, Males A healthy lifestyle and preventative care can promote health and wellness.  Maintain regular health, dental, and eye exams.   Eat a healthy diet. Foods like vegetables, fruits, whole grains, low-fat dairy products, and lean protein foods contain the nutrients you need without too many calories. Decrease your intake of foods high in solid fats, added sugars, and salt. Get information about a proper diet from your caregiver, if necessary.   Regular physical exercise is one of the most important things you can do for your health. Most adults should get at least 150 minutes of moderate-intensity exercise (any activity that increases your heart rate and causes you to sweat) each week. In addition, most adults need muscle-strengthening exercises on 2 or more days a week.    Maintain a healthy weight. The body mass index (BMI) is a screening tool to identify possible weight problems. It provides an estimate of body fat based on height and weight. Your caregiver can help determine your BMI, and can help you achieve or maintain a healthy weight. For adults 20 years and older:   A BMI below 18.5 is considered underweight.   A BMI of 18.5 to 24.9 is normal.   A BMI of 25 to 29.9 is considered overweight.   A BMI of 30 and above is considered obese.   Maintain normal blood lipids and cholesterol by exercising and minimizing your intake of saturated fat. Eat a balanced diet with plenty of fruits and vegetables. Blood tests for lipids and cholesterol should begin at age 20 and be repeated every 5 years. If your lipid or cholesterol levels are high, you are over 50, or you are a high risk for heart disease, you may need your cholesterol levels checked more frequently.Ongoing high lipid and cholesterol levels should be treated with medicines, if diet and exercise are not effective.   If you smoke, find out from your caregiver how to quit. If you do not use tobacco, do not start.    If you choose to drink alcohol, do not exceed 2 drinks per day. One drink is considered to be 12 ounces (355 mL) of beer, 5 ounces (148 mL) of wine, or 1.5 ounces (44 mL) of liquor.   Avoid use of street drugs. Do not share needles with anyone. Ask for help if you need support or instructions about stopping the use of drugs.   High blood pressure causes heart disease and increases the risk of stroke. Blood pressure should be checked at least every 1 to 2 years. Ongoing high blood pressure should be treated with medicines if weight loss and exercise are not effective.   If you are 45 to 38 years old, ask your caregiver if you should take aspirin to prevent heart disease.   Diabetes screening involves taking a blood sample to check your fasting blood sugar level. This should be done once every 3 years, after age 45, if you are within normal weight and without risk factors for diabetes. Testing should be considered at a younger age or be carried out more frequently if you are overweight and have at least 1 risk factor for diabetes.   Colorectal cancer can be detected and often prevented. Most routine colorectal cancer screening begins at the age of 50 and continues through age 75. However, your caregiver may recommend screening at an earlier age if you have risk factors for colon cancer. On a yearly basis, your caregiver may provide home test kits to check for hidden   blood in the stool. Use of a small camera at the end of a tube, to directly examine the colon (sigmoidoscopy or colonoscopy), can detect the earliest forms of colorectal cancer. Talk to your caregiver about this at age 50, when routine screening begins. Direct examination of the colon should be repeated every 5 to 10 years through age 75, unless early forms of pre-cancerous polyps or small growths are found.   Healthy men should no longer receive prostate-specific antigen (PSA) blood tests as part of routine cancer screening. Consult with  your caregiver about prostate cancer screening.   Practice safe sex. Use condoms and avoid high-risk sexual practices to reduce the spread of sexually transmitted infections (STIs).   Use sunscreen with a sun protection factor (SPF) of 30 or greater. Apply sunscreen liberally and repeatedly throughout the day. You should seek shade when your shadow is shorter than you. Protect yourself by wearing long sleeves, pants, a wide-brimmed hat, and sunglasses year round, whenever you are outdoors.   Notify your caregiver of new moles or changes in moles, especially if there is a change in shape or color. Also notify your caregiver if a mole is larger than the size of a pencil eraser.   A one-time screening for abdominal aortic aneurysm (AAA) and surgical repair of large AAAs by sound wave imaging (ultrasonography) is recommended for ages 65 to 75 years who are current or former smokers.   Stay current with your immunizations.  Document Released: 06/13/2008 Document Revised: 08/28/2011 Document Reviewed: 05/13/2011 [employer] Patient Information 2012 [employer]. 

## 2011-12-12 ENCOUNTER — Encounter: Payer: Self-pay | Admitting: Internal Medicine

## 2011-12-12 NOTE — Assessment & Plan Note (Signed)
I will check the levels on his meds and will look at labs for toxicity

## 2011-12-12 NOTE — Progress Notes (Signed)
  Subjective:    Patient ID: Ricky Soto, male    DOB: [birth year], 40 y.o.   MRN: [id number]  HPI  He returns for a complete physical and he offers no complaints.  Review of Systems  Constitutional: Negative.   HENT: Negative.   Eyes: Negative.   Respiratory: Negative.   Cardiovascular: Negative.   Gastrointestinal: Negative.   Genitourinary: Negative.   Musculoskeletal: Negative.   Skin: Negative.   Neurological: Negative.   Hematological: Negative.   Psychiatric/Behavioral: Negative.        Objective:   Physical Exam  Vitals reviewed. Constitutional: He is oriented to person, place, and time. He appears well-developed and well-nourished. No distress.  HENT:  Head: Normocephalic and atraumatic.  Mouth/Throat: Oropharynx is clear and moist. No oropharyngeal exudate.  Eyes: Conjunctivae are normal. Right eye exhibits no discharge. Left eye exhibits no discharge. No scleral icterus.  Neck: Normal range of motion. Neck supple. No JVD present. No tracheal deviation present. No thyromegaly present.  Cardiovascular: Normal rate, regular rhythm, normal heart sounds and intact distal pulses.  Exam reveals no gallop and no friction rub.   No murmur heard. Pulmonary/Chest: Effort normal and breath sounds normal. No stridor. No respiratory distress. He has no wheezes. He has no rales. He exhibits no tenderness.  Abdominal: Soft. Bowel sounds are normal. He exhibits no distension. There is no tenderness. There is no rebound and no guarding. Hernia confirmed negative in the right inguinal area and confirmed negative in the left inguinal area.  Genitourinary: Testes normal and penis normal. Right testis shows no mass, no swelling and no tenderness. Right testis is descended. Left testis shows no mass, no swelling and no tenderness. Left testis is descended. Circumcised. No penile tenderness. No discharge found.  Musculoskeletal: Normal range of motion. He exhibits no edema and no  tenderness.  Lymphadenopathy:    He has no cervical adenopathy.       Right: No inguinal adenopathy present.       Left: No inguinal adenopathy present.  Neurological: He is oriented to person, place, and time.  Skin: Skin is warm and dry. No rash noted. He is not diaphoretic. No erythema. No pallor.  Psychiatric: He has a normal mood and affect. His behavior is normal. Judgment and thought content normal.          Assessment & Plan:

## 2011-12-12 NOTE — Assessment & Plan Note (Signed)
This is being treated by Dr. Terri Piedra

## 2011-12-12 NOTE — Assessment & Plan Note (Signed)
Exam done, labs ordered, vaccines updated, pt ed material was given 

## 2011-12-13 LAB — LEVETIRACETAM LEVEL: Keppra (Levetiracetam): 9 ug/mL (ref 5.0–30.0)

## 2012-01-22 ENCOUNTER — Other Ambulatory Visit: Payer: Self-pay | Admitting: Internal Medicine

## 2013-02-17 ENCOUNTER — Other Ambulatory Visit: Payer: Self-pay | Admitting: Internal Medicine

## 2013-02-19 ENCOUNTER — Other Ambulatory Visit: Payer: Self-pay | Admitting: Internal Medicine

## 2013-03-03 ENCOUNTER — Encounter: Payer: Managed Care, Other (non HMO) | Admitting: Internal Medicine

## 2013-03-18 ENCOUNTER — Encounter: Payer: Self-pay | Admitting: Internal Medicine

## 2013-03-18 ENCOUNTER — Ambulatory Visit (INDEPENDENT_AMBULATORY_CARE_PROVIDER_SITE_OTHER): Payer: BC Managed Care – PPO | Admitting: Internal Medicine

## 2013-03-18 ENCOUNTER — Other Ambulatory Visit (INDEPENDENT_AMBULATORY_CARE_PROVIDER_SITE_OTHER): Payer: BC Managed Care – PPO

## 2013-03-18 VITALS — BP 108/80 | HR 61 | Temp 97.1°F | Resp 16 | Ht 71.0 in | Wt 252.0 lb

## 2013-03-18 DIAGNOSIS — Z0000 Encounter for general adult medical examination without abnormal findings: Secondary | ICD-10-CM

## 2013-03-18 DIAGNOSIS — B9689 Other specified bacterial agents as the cause of diseases classified elsewhere: Secondary | ICD-10-CM | POA: Insufficient documentation

## 2013-03-18 LAB — CBC WITH DIFFERENTIAL/PLATELET
Basophils Absolute: 0 10*3/uL (ref 0.0–0.1)
Lymphocytes Relative: 17.4 % (ref 12.0–46.0)
Lymphs Abs: 1.2 10*3/uL (ref 0.7–4.0)
Monocytes Relative: 4.8 % (ref 3.0–12.0)
Neutrophils Relative %: 76.1 % (ref 43.0–77.0)
Platelets: 231 10*3/uL (ref 150.0–400.0)
RDW: 12.4 % (ref 11.5–14.6)

## 2013-03-18 LAB — COMPREHENSIVE METABOLIC PANEL
ALT: 26 U/L (ref 0–53)
CO2: 30 mEq/L (ref 19–32)
Chloride: 102 mEq/L (ref 96–112)
GFR: 87.25 mL/min (ref >60.00)
Sodium: 139 mEq/L (ref 135–145)
Total Bilirubin: 0.4 mg/dL (ref 0.3–1.2)
Total Protein: 7.5 g/dL (ref 6.0–8.3)

## 2013-03-18 LAB — LIPID PANEL
Cholesterol: 198 mg/dL (ref 0–200)
HDL: 36 mg/dL — ABNORMAL LOW (ref >39.00)
Triglycerides: 53 mg/dL (ref 0.0–149.0)
VLDL: 10.6 mg/dL (ref 0.0–40.0)

## 2013-03-18 LAB — URINALYSIS, ROUTINE W REFLEX MICROSCOPIC
Ketones, ur: NEGATIVE
Specific Gravity, Urine: 1.015 (ref 1.000–1.030)
Urine Glucose: NEGATIVE
Urobilinogen, UA: 0.2 (ref 0.0–1.0)

## 2013-03-18 MED ORDER — AZITHROMYCIN 500 MG PO TABS: 500.0000 mg | ORAL_TABLET | Freq: Every day | ORAL | Status: DC

## 2013-03-18 NOTE — Assessment & Plan Note (Signed)
Exam done Vaccines were reviewed Labs ordered Pt ed material was given 

## 2013-03-18 NOTE — Assessment & Plan Note (Signed)
He is doing well with no reported seizure activity I will check his Keppra level today

## 2013-03-18 NOTE — Assessment & Plan Note (Signed)
Start zpak for the infection 

## 2013-03-18 NOTE — Patient Instructions (Signed)
Health Maintenance, Males A healthy lifestyle and preventative care can promote health and wellness.  Maintain regular health, dental, and eye exams.  Eat a healthy diet. Foods like vegetables, fruits, whole grains, low-fat dairy products, and lean protein foods contain the nutrients you need without too many calories. Decrease your intake of foods high in solid fats, added sugars, and salt. Get information about a proper diet from your caregiver, if necessary.  Regular physical exercise is one of the most important things you can do for your health. Most adults should get at least 150 minutes of moderate-intensity exercise (any activity that increases your heart rate and causes you to sweat) each week. In addition, most adults need muscle-strengthening exercises on 2 or more days a week.   Maintain a healthy weight. The body mass index (BMI) is a screening tool to identify possible weight problems. It provides an estimate of body fat based on height and weight. Your caregiver can help determine your BMI, and can help you achieve or maintain a healthy weight. For adults 20 years and older:  A BMI below 18.5 is considered underweight.  A BMI of 18.5 to 24.9 is normal.  A BMI of 25 to 29.9 is considered overweight.  A BMI of 30 and above is considered obese.  Maintain normal blood lipids and cholesterol by exercising and minimizing your intake of saturated fat. Eat a balanced diet with plenty of fruits and vegetables. Blood tests for lipids and cholesterol should begin at age 20 and be repeated every 5 years. If your lipid or cholesterol levels are high, you are over 50, or you are a high risk for heart disease, you may need your cholesterol levels checked more frequently.Ongoing high lipid and cholesterol levels should be treated with medicines, if diet and exercise are not effective.  If you smoke, find out from your caregiver how to quit. If you do not use tobacco, do not start.  If you  choose to drink alcohol, do not exceed 2 drinks per day. One drink is considered to be 12 ounces (355 mL) of beer, 5 ounces (148 mL) of wine, or 1.5 ounces (44 mL) of liquor.  Avoid use of street drugs. Do not share needles with anyone. Ask for help if you need support or instructions about stopping the use of drugs.  High blood pressure causes heart disease and increases the risk of stroke. Blood pressure should be checked at least every 1 to 2 years. Ongoing high blood pressure should be treated with medicines if weight loss and exercise are not effective.  If you are 45 to 40 years old, ask your caregiver if you should take aspirin to prevent heart disease.  Diabetes screening involves taking a blood sample to check your fasting blood sugar level. This should be done once every 3 years, after age 45, if you are within normal weight and without risk factors for diabetes. Testing should be considered at a younger age or be carried out more frequently if you are overweight and have at least 1 risk factor for diabetes.  Colorectal cancer can be detected and often prevented. Most routine colorectal cancer screening begins at the age of 50 and continues through age 75. However, your caregiver may recommend screening at an earlier age if you have risk factors for colon cancer. On a yearly basis, your caregiver may provide home test kits to check for hidden blood in the stool. Use of a small camera at the end of a tube,   to directly examine the colon (sigmoidoscopy or colonoscopy), can detect the earliest forms of colorectal cancer. Talk to your caregiver about this at age 50, when routine screening begins. Direct examination of the colon should be repeated every 5 to 10 years through age 75, unless early forms of pre-cancerous polyps or small growths are found.  Hepatitis C blood testing is recommended for all people born from 1945 through 1965 and any individual with known risks for hepatitis C.  Healthy  men should no longer receive prostate-specific antigen (PSA) blood tests as part of routine cancer screening. Consult with your caregiver about prostate cancer screening.  Testicular cancer screening is not recommended for adolescents or adult males who have no symptoms. Screening includes self-exam, caregiver exam, and other screening tests. Consult with your caregiver about any symptoms you have or any concerns you have about testicular cancer.  Practice safe sex. Use condoms and avoid high-risk sexual practices to reduce the spread of sexually transmitted infections (STIs).  Use sunscreen with a sun protection factor (SPF) of 30 or greater. Apply sunscreen liberally and repeatedly throughout the day. You should seek shade when your shadow is shorter than you. Protect yourself by wearing long sleeves, pants, a wide-brimmed hat, and sunglasses year round, whenever you are outdoors.  Notify your caregiver of new moles or changes in moles, especially if there is a change in shape or color. Also notify your caregiver if a mole is larger than the size of a pencil eraser.  A one-time screening for abdominal aortic aneurysm (AAA) and surgical repair of large AAAs by sound wave imaging (ultrasonography) is recommended for ages 65 to 75 years who are current or former smokers.  Stay current with your immunizations. Document Released: 06/13/2008 Document Revised: 03/09/2012 Document Reviewed: 05/13/2011 Exit[employer] Patient Information 2013 [employer].  

## 2013-03-18 NOTE — Progress Notes (Signed)
Subjective:    Patient ID: Ricky Soto, male    DOB: [birth year], 42 y.o.   MRN: [id number]  Sinusitis This is a new problem. The current episode started 1 to 4 weeks ago. The problem has been gradually worsening since onset. There has been no fever. The fever has been present for less than 1 day. His pain is at a severity of 0/10. He is experiencing no pain. Associated symptoms include chills, congestion, a hoarse voice, sinus pressure and a sore throat. Pertinent negatives include no coughing, diaphoresis, ear pain, headaches, neck pain, shortness of breath, sneezing or swollen glands. Past treatments include oral decongestants. The treatment provided mild relief.      Review of Systems  Constitutional: Positive for chills. Negative for fever, diaphoresis, activity change, appetite change, fatigue and unexpected weight change.  HENT: Positive for congestion, sore throat, hoarse voice, rhinorrhea, postnasal drip and sinus pressure. Negative for ear pain, nosebleeds, facial swelling, sneezing, trouble swallowing, neck pain and tinnitus.   Eyes: Negative.   Respiratory: Negative for cough, chest tightness, shortness of breath, wheezing and stridor.   Cardiovascular: Negative for chest pain, palpitations and leg swelling.  Gastrointestinal: Negative for nausea, abdominal pain, diarrhea, constipation and blood in stool.  Endocrine: Negative.   Genitourinary: Negative.   Musculoskeletal: Negative.   Skin: Negative.   Allergic/Immunologic: Negative.   Neurological: Negative.  Negative for dizziness, tremors, seizures, facial asymmetry, weakness, light-headedness and headaches.  Hematological: Negative for adenopathy. Does not bruise/bleed easily.  Psychiatric/Behavioral: Negative.        Objective:   Physical Exam  Vitals reviewed. Constitutional: He is oriented to person, place, and time. He appears well-developed and well-nourished.  Non-toxic appearance. He does not have a sickly  appearance. He does not appear ill. No distress.  HENT:  Head: Normocephalic and atraumatic. No trismus in the jaw.  Right Ear: Hearing, tympanic membrane, external ear and ear canal normal.  Left Ear: Hearing, tympanic membrane, external ear and ear canal normal.  Nose: No mucosal edema, rhinorrhea, sinus tenderness, septal deviation or nasal septal hematoma. No epistaxis.  No foreign bodies. Right sinus exhibits maxillary sinus tenderness. Right sinus exhibits no frontal sinus tenderness. Left sinus exhibits maxillary sinus tenderness. Left sinus exhibits no frontal sinus tenderness.  Mouth/Throat: Oropharynx is clear and moist and mucous membranes are normal. Mucous membranes are not pale, not dry and not cyanotic. No oral lesions. No edematous. No oropharyngeal exudate, posterior oropharyngeal edema, posterior oropharyngeal erythema or tonsillar abscesses.  Eyes: Conjunctivae are normal. Right eye exhibits no discharge. Left eye exhibits no discharge. No scleral icterus.  Neck: Normal range of motion. Neck supple. No JVD present. No tracheal deviation present. No thyromegaly present.  Cardiovascular: Normal rate, regular rhythm, normal heart sounds and intact distal pulses.  Exam reveals no gallop and no friction rub.   No murmur heard. Pulmonary/Chest: Effort normal and breath sounds normal. No stridor. No respiratory distress. He has no wheezes. He has no rales. He exhibits no tenderness.  Abdominal: Soft. Bowel sounds are normal. He exhibits no distension and no mass. There is no tenderness. There is no rebound and no guarding. Hernia confirmed negative in the right inguinal area and confirmed negative in the left inguinal area.  Genitourinary: Testes normal and penis normal. Right testis shows no mass, no swelling and no tenderness. Right testis is descended. Left testis shows no mass, no swelling and no tenderness. Left testis is descended. Circumcised. No penile erythema or penile tenderness.  No discharge  found.  Musculoskeletal: Normal range of motion. He exhibits no edema and no tenderness.  Lymphadenopathy:    He has no cervical adenopathy.       Right: No inguinal adenopathy present.       Left: No inguinal adenopathy present.  Neurological: He is oriented to person, place, and time.  Skin: Skin is warm and dry. No rash noted. He is not diaphoretic. No erythema. No pallor.  Psychiatric: He has a normal mood and affect. His behavior is normal. Judgment and thought content normal.      Lab Results  Component Value Date   WBC 5.4 12/11/2011   HGB 14.4 12/11/2011   HCT 42.5 12/11/2011   PLT 221.0 12/11/2011   GLUCOSE 89 12/11/2011   CHOL 233* 12/11/2011   TRIG 65.0 12/11/2011   HDL 42.40 12/11/2011   LDLDIRECT 171.6 12/11/2011   ALT 39 12/11/2011   AST 39* 12/11/2011   NA 139 12/11/2011   K 4.3 12/11/2011   CL 103 12/11/2011   CREATININE 1.3 12/11/2011   BUN 18 12/11/2011   CO2 29 12/11/2011   TSH 0.48 12/11/2011      Assessment & Plan:

## 2013-03-19 ENCOUNTER — Encounter: Payer: Self-pay | Admitting: Internal Medicine

## 2013-03-26 ENCOUNTER — Other Ambulatory Visit: Payer: Self-pay | Admitting: Internal Medicine

## 2013-05-07 ENCOUNTER — Other Ambulatory Visit: Payer: Self-pay

## 2013-05-07 MED ORDER — ACYCLOVIR 800 MG PO TABS: ORAL_TABLET | ORAL | Status: DC

## 2013-06-03 ENCOUNTER — Other Ambulatory Visit: Payer: Self-pay | Admitting: Internal Medicine

## 2013-06-04 ENCOUNTER — Telehealth: Payer: Self-pay

## 2013-06-04 MED ORDER — ACYCLOVIR 800 MG PO TABS: 800.0000 mg | ORAL_TABLET | Freq: Two times a day (BID) | ORAL | Status: DC

## 2013-06-04 NOTE — Telephone Encounter (Signed)
Patient called for a refill of Acyclovir

## 2013-07-20 ENCOUNTER — Telehealth: Payer: Self-pay | Admitting: Neurology

## 2013-07-20 MED ORDER — LEVETIRACETAM 500 MG PO TABS: 500.0000 mg | ORAL_TABLET | Freq: Two times a day (BID) | ORAL | Status: DC

## 2013-07-20 NOTE — Telephone Encounter (Signed)
Patient was last seen in May 2013, not May 2014.  Sent 1 refill noting appt is needed.

## 2013-07-20 NOTE — Telephone Encounter (Signed)
[facility] pharmacy said patient needs a refill on her generic Keppra.  The patient has not been seen since 05-28-13.

## 2013-07-21 ENCOUNTER — Other Ambulatory Visit: Payer: Self-pay | Admitting: Neurology

## 2013-09-30 ENCOUNTER — Encounter: Payer: Self-pay | Admitting: Nurse Practitioner

## 2013-09-30 ENCOUNTER — Ambulatory Visit (INDEPENDENT_AMBULATORY_CARE_PROVIDER_SITE_OTHER): Payer: BC Managed Care – PPO | Admitting: Nurse Practitioner

## 2013-09-30 VITALS — BP 143/99 | HR 67 | Ht 71.75 in

## 2013-09-30 DIAGNOSIS — G40209 Localization-related (focal) (partial) symptomatic epilepsy and epileptic syndromes with complex partial seizures, not intractable, without status epilepticus: Secondary | ICD-10-CM

## 2013-09-30 MED ORDER — LEVETIRACETAM 500 MG PO TABS: 500.0000 mg | ORAL_TABLET | Freq: Two times a day (BID) | ORAL | Status: DC

## 2013-09-30 NOTE — Progress Notes (Signed)
[facility]  PATIENT: Ricky Soto DOB: [birth year]   REASON FOR VISIT: Followup seizure disorder  HISTORY OF PRESENT ILLNESS: Mr. Ricky Soto, 42 yr old black male returns today for followup. He was last seen 05/28/12.  He has been followed in this office since 2000 for complex partial seizures. He switched to generic  Keppra at his last visit without difficulty.  His last known seizure event was in 2004.  Today he denies any interval seizures. No new  medical problems. He denies any side effects of the Keppra.   REVIEW OF SYSTEMS: Full 14 system review of systems performed and notable only for:  Constitutional: N/A  Cardiovascular: N/A  Ear/Nose/Throat: N/A  Skin: N/A  Eyes: N/A  Respiratory: N/A  Gastroitestinal: N/A  Hematology/Lymphatic: N/A  Endocrine: N/A Musculoskeletal:N/A  Allergy/Immunology: Seasonal allergies  Neurological: N/A Psychiatric: N/A   ALLERGIES: Allergies  Allergen Reactions  . Penicillins     REACTION: childhood reaction- high fever    HOME MEDICATIONS: Outpatient Prescriptions Prior to Visit  Medication Sig Dispense Refill  . acyclovir (ZOVIRAX) 800 MG tablet Take 1 tablet (800 mg total) by mouth 2 (two) times daily.  60 tablet  4  . levETIRAcetam (KEPPRA) 500 MG tablet TAKE 1 TABLET (500 MG TOTAL) BY MOUTH 2 (TWO) TIMES DAILY.  60 tablet  3  . azithromycin (ZITHROMAX) 500 MG tablet Take 1 tablet (500 mg total) by mouth daily.  3 tablet  0   No facility-administered medications prior to visit.    PAST MEDICAL HISTORY: Past Medical History  Diagnosis Date  . Epilepsy   . Allergy   . Herpes   . Lichenoid dermatitis     perianal    PAST SURGICAL HISTORY: Past Surgical History  Procedure Laterality Date  . Cystectomy    . Tonsillectomy      FAMILY HISTORY: Family History  Problem Relation Age of Onset  . Hypertension Mother   . Hyperlipidemia Paternal Grandfather   . Cancer Neg Hx   . Heart disease Neg Hx   .  Diabetes Neg Hx     SOCIAL HISTORY: History   Social History  . Marital Status: Married    Spouse Name: N/A    Number of Children: 1  . Years of Education: [employer]   Occupational History  .     Social History Main Topics  . Smoking status: Never Smoker   . Smokeless tobacco: Never Used  . Alcohol Use: Yes     Comment: end of year.  . Drug Use: No  . Sexual Activity: Yes   Other Topics Concern  . Not on file   Social History Narrative   Patient lives at home with Claudie Fisherman a son.   Patient graduated form [employer].    Patient has 1 child.      PHYSICAL EXAM  Filed Vitals:   09/30/13 1133  BP: 143/99  Pulse: 67  Height: 5' 11.75" (1.822 m)   There is no weight on file to calculate BMI.  Generalized: Well developed, in no acute distress  Head: normocephalic and atraumatic,. Oropharynx benign  Neck: Supple, no carotid bruits  Cardiac: Regular rate rhythm, no murmur  Musculoskeletal: No deformity   Neurological examination   Mentation: Alert oriented to time, place, history taking. Follows all commands speech and language fluent  Cranial nerve II-XII: Pupils were equal round reactive to light extraocular movements were full, visual field were full on confrontational test. Facial sensation and strength were normal. hearing  was intact to finger rubbing bilaterally. Uvula tongue midline. head turning and shoulder shrug and were normal and symmetric.Tongue protrusion into cheek strength was normal. Motor: normal bulk and tone, full strength in the BUE, BLE, fine finger movements normal, no pronator drift. No focal weakness Coordination: finger-nose-finger, heel-to-shin bilaterally, no dysmetria Reflexes: 1+ upper lower and symmetric  Gait and Station: Rising up from seated position without assistance, normal stance,  moderate stride, good arm swing, smooth turning, able to perform tiptoe, and heel walking without difficulty. Tandem normal  DIAGNOSTIC DATA (LABS, IMAGING,  TESTING) - I reviewed patient records, labs, notes, testing and imaging myself where available.  Lab Results  Component Value Date   WBC 7.0 03/18/2013   HGB 13.9 03/18/2013   HCT 40.9 03/18/2013   MCV 90.0 03/18/2013   PLT 231.0 03/18/2013      Component Value Date/Time   NA 139 03/18/2013 1357   K 4.4 03/18/2013 1357   CL 102 03/18/2013 1357   CO2 30 03/18/2013 1357   GLUCOSE 96 03/18/2013 1357   BUN 10 03/18/2013 1357   CREATININE 1.2 03/18/2013 1357   CALCIUM 9.2 03/18/2013 1357   PROT 7.5 03/18/2013 1357   ALBUMIN 4.2 03/18/2013 1357   AST 25 03/18/2013 1357   ALT 26 03/18/2013 1357   ALKPHOS 41 03/18/2013 1357   BILITOT 0.4 03/18/2013 1357   GFRNONAA 68.12 12/12/2010 0822   Lab Results  Component Value Date   CHOL 198 03/18/2013   HDL 36.00* 03/18/2013   LDLCALC 151* 03/18/2013   LDLDIRECT 171.6 12/11/2011   TRIG 53.0 03/18/2013   CHOLHDL 6 03/18/2013     Lab Results  Component Value Date   TSH 0.54 03/18/2013      ASSESSMENT AND PLAN  42 y.o. year old male  has a past medical history of Epilepsy; Allergy; Herpes; and Lichenoid dermatitis. here to followup for seizure disorder. Last seizure occurred in 2004  Continue Keppra at current dose Will renew for the next year Call for any seizure activity Followup yearly  and when necessary Nilda Riggs, [facility], [facility], APRN  [facility] [address] [place], [place] [address] [phone]

## 2013-09-30 NOTE — Patient Instructions (Addendum)
Continue Keppra at current dose Will renew for the next year Call for any seizure activity Followup yearly  and when necessary

## 2013-10-27 ENCOUNTER — Other Ambulatory Visit: Payer: Self-pay | Admitting: Internal Medicine

## 2014-03-21 ENCOUNTER — Encounter: Payer: Self-pay | Admitting: Physician Assistant

## 2014-03-21 ENCOUNTER — Ambulatory Visit (INDEPENDENT_AMBULATORY_CARE_PROVIDER_SITE_OTHER): Payer: BC Managed Care – PPO | Admitting: Physician Assistant

## 2014-03-21 VITALS — BP 180/110 | HR 56 | Temp 97.8°F | Ht 71.0 in | Wt 239.8 lb

## 2014-03-21 DIAGNOSIS — IMO0002 Reserved for concepts with insufficient information to code with codable children: Secondary | ICD-10-CM

## 2014-03-21 NOTE — Progress Notes (Signed)
Pre visit review using our clinic review tool, if applicable. No additional management support is needed unless otherwise documented below in the visit note. 

## 2014-03-21 NOTE — Progress Notes (Signed)
   Subjective:    Patient ID: Ricky Soto, male    DOB: 10/08/1971, 43 y.o.   MRN: [id number]  HPI Comments: Patient is a 43 year old male who presents to the office with complaint of dominant right thumb pain and swelling. Patient reports approximately one week prior to arrival he pulled a hangnail off of his thumb. States since then the area has become red, swollen and is now tender to the touch. Has used nothing to treat the area. Denies Nausea, vomiting, fever, decrease range of motion or sensation.   Review of Systems  Constitutional: Negative for fever and chills.  Gastrointestinal: Negative for nausea.  Musculoskeletal:       Right thumb pain, swelling  Skin:       As noted above  Neurological: Negative for dizziness, weakness and light-headedness.   Past Medical History  Diagnosis Date  . Epilepsy   . Allergy   . Herpes   . Lichenoid dermatitis     perianal      Objective:   Physical Exam  Vitals reviewed. Constitutional: He is oriented to person, place, and time. He appears well-developed and well-nourished. No distress.  HENT:  Head: Normocephalic and atraumatic.  Neck: Normal range of motion.  Cardiovascular: Normal rate and regular rhythm.  Exam reveals no gallop.   No murmur heard. Pulmonary/Chest: Effort normal and breath sounds normal. He has no wheezes. He has no rales.  Musculoskeletal:  Right thumb with paronychia to lateral nail folds. Minimal erythema, small area of fluctuance, slight tactile heat. Mildly tender to palpation. Does not involve the pad of the digit No red streaking, no decrease ROM. Good cap refill. Will require drainage.   Neurological: He is alert and oriented to person, place, and time.  Skin: Skin is warm and dry.  Psychiatric: He has a normal mood and affect.   Filed Vitals:   03/21/14 0842  BP: 180/110  Pulse: 56  Temp: 97.8 F (36.6 C)   BP Readings from Last 3 Encounters:  03/21/14 180/110  09/30/13 143/99  03/18/13  108/80     Reevaluate of blood pressure, my read 130/90    Assessment & Plan:    Paronychia Area was cleaned with Alcohol and lanced with an 18 gauge needle with small amount of pus removed. Cleaned after and bandaged placed to thumb. Patient instructed to soak thumb in epsom salt and warm water for approximately 20 minutes 2-3 times daily. No antibiotics at this time. Return precautions provided, including fever, N/V, red streaking of extremity.  Patient states understanding and agreement with treatment plan.

## 2014-03-21 NOTE — Patient Instructions (Signed)
It was great meeting you today Ricky Soto!   Paronychia  Paronychia is an infection of the skin caused by germs. It happens by the fingernail or toenail. You can avoid it by not:  Pulling on hangnails.  Nail biting.  Thumb sucking.  Cutting fingernails and toenails too short.  Cutting the skin at the base and sides of the fingernail or toenail (cuticle). HOME CARE  Keep the fingers or toes very dry. Put rubber gloves over cotton gloves when putting hands in water.  Keep the wound clean and bandaged (dressed) as told by your doctor.  Soak the fingers or toes in warm water for 15 to 20 minutes. Soak them 3 to 4 times per day for germ infections. Fungal infections are difficult to treat. Fungal infections often require treatment for a long time.  Only take medicine as told by your doctor. GET HELP RIGHT AWAY IF:   You have redness, puffiness (swelling), or pain that gets worse.  You see yellowish-white fluid (pus) coming from the wound.  You have a fever.  You have a bad smell coming from the wound or bandage. MAKE SURE YOU:  Understand these instructions.  Will watch your condition.  Will get help if you are not doing well or get worse. Document Released: 12/04/2009 Document Revised: 03/09/2012 Document Reviewed: 12/04/2009 [facility] Patient Information 2014 [employer]Care, Maryland[employer].

## 2014-05-05 ENCOUNTER — Other Ambulatory Visit: Payer: Self-pay | Admitting: Internal Medicine

## 2014-05-26 ENCOUNTER — Ambulatory Visit (INDEPENDENT_AMBULATORY_CARE_PROVIDER_SITE_OTHER): Payer: BC Managed Care – PPO | Admitting: Internal Medicine

## 2014-05-26 ENCOUNTER — Encounter: Payer: BC Managed Care – PPO | Admitting: Internal Medicine

## 2014-05-26 ENCOUNTER — Other Ambulatory Visit (INDEPENDENT_AMBULATORY_CARE_PROVIDER_SITE_OTHER): Payer: BC Managed Care – PPO

## 2014-05-26 ENCOUNTER — Encounter: Payer: Self-pay | Admitting: Internal Medicine

## 2014-05-26 ENCOUNTER — Ambulatory Visit (INDEPENDENT_AMBULATORY_CARE_PROVIDER_SITE_OTHER)
Admission: RE | Admit: 2014-05-26 | Discharge: 2014-05-26 | Disposition: A | Discharge status: Home / Self Care | Payer: BC Managed Care – PPO | Source: Ambulatory Visit | Attending: Internal Medicine | Admitting: Internal Medicine

## 2014-05-26 VITALS — BP 140/88 | HR 52 | Temp 98.7°F | Resp 16 | Ht 71.0 in | Wt 240.0 lb

## 2014-05-26 DIAGNOSIS — M79671 Pain in right foot: Secondary | ICD-10-CM

## 2014-05-26 DIAGNOSIS — M79609 Pain in unspecified limb: Secondary | ICD-10-CM

## 2014-05-26 DIAGNOSIS — Z0000 Encounter for general adult medical examination without abnormal findings: Secondary | ICD-10-CM

## 2014-05-26 DIAGNOSIS — E663 Overweight: Secondary | ICD-10-CM

## 2014-05-26 LAB — COMPREHENSIVE METABOLIC PANEL
ALBUMIN: 4.2 g/dL (ref 3.5–5.2)
ALT: 31 U/L (ref 0–53)
AST: 31 U/L (ref 0–37)
Alkaline Phosphatase: 29 U/L — ABNORMAL LOW (ref 39–117)
BILIRUBIN TOTAL: 0.9 mg/dL (ref 0.2–1.2)
BUN: 14 mg/dL (ref 6–23)
CO2: 31 mEq/L (ref 19–32)
Calcium: 9.3 mg/dL (ref 8.4–10.5)
Chloride: 103 mEq/L (ref 96–112)
Creatinine, Ser: 1.6 mg/dL — ABNORMAL HIGH (ref 0.4–1.5)
GFR: 63.45 mL/min (ref >60.00)
GLUCOSE: 85 mg/dL (ref 70–99)
POTASSIUM: 4.1 meq/L (ref 3.5–5.1)
Sodium: 141 mEq/L (ref 135–145)
TOTAL PROTEIN: 6.9 g/dL (ref 6.0–8.3)

## 2014-05-26 LAB — LIPID PANEL
CHOL/HDL RATIO: 4
CHOLESTEROL: 210 mg/dL — AB (ref 0–200)
HDL: 56.7 mg/dL (ref >39.00)
LDL Cholesterol: 142 mg/dL — ABNORMAL HIGH (ref 0–99)
Triglycerides: 57 mg/dL (ref 0.0–149.0)
VLDL: 11.4 mg/dL (ref 0.0–40.0)

## 2014-05-26 LAB — URINALYSIS, ROUTINE W REFLEX MICROSCOPIC
Bilirubin Urine: NEGATIVE
HGB URINE DIPSTICK: NEGATIVE
Ketones, ur: NEGATIVE
Leukocytes, UA: NEGATIVE
NITRITE: NEGATIVE
PH: 6 (ref 5.0–8.0)
RBC / HPF: NONE SEEN (ref 0–?)
SPECIFIC GRAVITY, URINE: 1.02 (ref 1.000–1.030)
Total Protein, Urine: NEGATIVE
URINE GLUCOSE: NEGATIVE
Urobilinogen, UA: 0.2 (ref 0.0–1.0)
WBC, UA: NONE SEEN (ref 0–?)

## 2014-05-26 LAB — CBC WITH DIFFERENTIAL/PLATELET
Basophils Absolute: 0 10*3/uL (ref 0.0–0.1)
Basophils Relative: 0.4 % (ref 0.0–3.0)
EOS ABS: 0.1 10*3/uL (ref 0.0–0.7)
Eosinophils Relative: 1.4 % (ref 0.0–5.0)
HEMATOCRIT: 42.8 % (ref 39.0–52.0)
Hemoglobin: 14.2 g/dL (ref 13.0–17.0)
Lymphocytes Relative: 25 % (ref 12.0–46.0)
Lymphs Abs: 1.5 10*3/uL (ref 0.7–4.0)
MCHC: 33.2 g/dL (ref 30.0–36.0)
MCV: 91.7 fl (ref 78.0–100.0)
MONO ABS: 0.3 10*3/uL (ref 0.1–1.0)
Monocytes Relative: 5.2 % (ref 3.0–12.0)
NEUTROS PCT: 68 % (ref 43.0–77.0)
Neutro Abs: 4 10*3/uL (ref 1.4–7.7)
PLATELETS: 202 10*3/uL (ref 150.0–400.0)
RBC: 4.67 Mil/uL (ref 4.22–5.81)
RDW: 12.8 % (ref 11.5–15.5)
WBC: 5.9 10*3/uL (ref 4.0–10.5)

## 2014-05-26 LAB — FECAL OCCULT BLOOD, GUAIAC: Fecal Occult Blood: NEGATIVE

## 2014-05-26 LAB — PSA: PSA: 0.69 ng/mL (ref 0.10–4.00)

## 2014-05-26 LAB — TSH: TSH: 0.86 u[IU]/mL (ref 0.35–4.50)

## 2014-05-26 NOTE — Progress Notes (Signed)
Pre visit review using our clinic review tool, if applicable. No additional management support is needed unless otherwise documented below in the visit note. 

## 2014-05-26 NOTE — Assessment & Plan Note (Signed)
Exam done Vaccines were reviewed Labs ordered Pt ed material was given 

## 2014-05-26 NOTE — Assessment & Plan Note (Signed)
He is working on his lifestyle modifications to lose weight

## 2014-05-26 NOTE — Patient Instructions (Signed)
Foot Sprain The muscles and cord like structures which attach muscle to bone (tendons) that surround the feet are made up of units. A foot sprain can occur at the weakest spot in any of these units. This condition is most often caused by injury to or overuse of the foot, as from playing contact sports, or aggravating a previous injury, or from poor conditioning, or obesity. SYMPTOMS  Pain with movement of the foot.  Tenderness and swelling at the injury site.  Loss of strength is present in moderate or severe sprains. THE THREE GRADES OR SEVERITY OF FOOT SPRAIN ARE:  Mild (Grade I): Slightly pulled muscle without tearing of muscle or tendon fibers or loss of strength.  Moderate (Grade II): Tearing of fibers in a muscle, tendon, or at the attachment to bone, with small decrease in strength.  Severe (Grade III): Rupture of the muscle-tendon-bone attachment, with separation of fibers. Severe sprain requires surgical repair. Often repeating (chronic) sprains are caused by overuse. Sudden (acute) sprains are caused by direct injury or over-use. DIAGNOSIS  Diagnosis of this condition is usually by your own observation. If problems continue, a caregiver may be required for further evaluation and treatment. X-rays may be required to make sure there are not breaks in the bones (fractures) present. Continued problems may require physical therapy for treatment. PREVENTION  Use strength and conditioning exercises appropriate for your sport.  Warm up properly prior to working out.  Use athletic shoes that are made for the sport you are participating in.  Allow adequate time for healing. Early return to activities makes repeat injury more likely, and can lead to an unstable arthritic foot that can result in prolonged disability. Mild sprains generally heal in 3 to 10 days, with moderate and severe sprains taking 2 to 10 weeks. Your caregiver can help you determine the proper time required for  healing. HOME CARE INSTRUCTIONS   Apply ice to the injury for 15-20 minutes, 03-04 times per day. Put the ice in a plastic bag and place a towel between the bag of ice and your skin.  An elastic wrap (like an Ace bandage) may be used to keep swelling down.  Keep foot above the level of the heart, or at least raised on a footstool, when swelling and pain are present.  Try to avoid use other than gentle range of motion while the foot is painful. Do not resume use until instructed by your caregiver. Then begin use gradually, not increasing use to the point of pain. If pain does develop, decrease use and continue the above measures, gradually increasing activities that do not cause discomfort, until you gradually achieve normal use.  Use crutches if and as instructed, and for the length of time instructed.  Keep injured foot and ankle wrapped between treatments.  Massage foot and ankle for comfort and to keep swelling down. Massage from the toes up towards the knee.  Only take over-the-counter or prescription medicines for pain, discomfort, or fever as directed by your caregiver. SEEK IMMEDIATE MEDICAL CARE IF:   Your pain and swelling increase, or pain is not controlled with medications.  You have loss of feeling in your foot or your foot turns cold or blue.  You develop new, unexplained symptoms, or an increase of the symptoms that brought you to your caregiver. MAKE SURE YOU:   Understand these instructions.  Will watch your condition.  Will get help right away if you are not doing well or get worse. Document Released:   06/07/2002 Document Revised: 03/09/2012 Document Reviewed: 08/04/2008 [facility] Patient Information 2014 [place], [place]. Health Maintenance, Males A healthy lifestyle and preventative care can promote health and wellness.  Maintain regular health, dental, and eye exams.  Eat a healthy diet. Foods like vegetables, fruits, whole grains, low-fat dairy products, and  lean protein foods contain the nutrients you need and are low in calories. Decrease your intake of foods high in solid fats, added sugars, and salt. Get information about a proper diet from your health care provider, if necessary.  Regular physical exercise is one of the most important things you can do for your health. Most adults should get at least 150 minutes of moderate-intensity exercise (any activity that increases your heart rate and causes you to sweat) each week. In addition, most adults need muscle-strengthening exercises on 2 or more days a week.   Maintain a healthy weight. The body mass index (BMI) is a screening tool to identify possible weight problems. It provides an estimate of body fat based on height and weight. Your health care provider can find your BMI and can help you achieve or maintain a healthy weight. For males 20 years and older:  A BMI below 18.5 is considered underweight.  A BMI of 18.5 to 24.9 is normal.  A BMI of 25 to 29.9 is considered overweight.  A BMI of 30 and above is considered obese.  Maintain normal blood lipids and cholesterol by exercising and minimizing your intake of saturated fat. Eat a balanced diet with plenty of fruits and vegetables. Blood tests for lipids and cholesterol should begin at age 41 and be repeated every 5 years. If your lipid or cholesterol levels are high, you are over 50, or you are at high risk for heart disease, you may need your cholesterol levels checked more frequently.Ongoing high lipid and cholesterol levels should be treated with medicines, if diet and exercise are not working.  If you smoke, find out from your health care provider how to quit. If you do not use tobacco, do not start.  Lung cancer screening is recommended for adults aged 41 80 years who are at high risk for developing lung cancer because of a history of smoking. A yearly low-dose CT scan of the lungs is recommended for people who have at least a  30-pack-year history of smoking and are a current smoker or have quit within the past 15 years. A pack year of smoking is smoking an average of 1 pack of cigarettes a day for 1 year (for example, a 30-pack-year history of smoking could mean smoking 1 pack a day for 30 years or 2 packs a day for 15 years). Yearly screening should continue until the smoker has stopped smoking for at least 15 years. Yearly screening should be stopped for people who develop a health problem that would prevent them from having lung cancer treatment.  If you choose to drink alcohol, do not have more than 2 drinks per day. One drink is considered to be 12 oz (360 mL) of beer, 5 oz (150 mL) of wine, or 1.5 oz (45 mL) of liquor.  Avoid use of street drugs. Do not share needles with anyone. Ask for help if you need support or instructions about stopping the use of drugs.  High blood pressure causes heart disease and increases the risk of stroke. Blood pressure should be checked at least every 1 2 years. Ongoing high blood pressure should be treated with medicines if weight loss and exercise  are not effective.  If you are 15 41 years old, ask your health care provider if you should take aspirin to prevent heart disease.  Diabetes screening involves taking a blood sample to check your fasting blood sugar level. This should be done once every 3 years after age 56, if you are at a normal weight and without risk factors for diabetes. Testing should be considered at a younger age or be carried out more frequently if you are overweight and have at least 1 risk factor for diabetes.  Colorectal cancer can be detected and often prevented. Most routine colorectal cancer screening begins at the age of 54 and continues through age 40. However, your health care provider may recommend screening at an earlier age if you have risk factors for colon cancer. On a yearly basis, your health care provider may provide home test kits to check for hidden  blood in the stool. A small camera at the end of a tube may be used to directly examine the colon (sigmoidoscopy or colonoscopy) to detect the earliest forms of colorectal cancer. Talk to your health care provider about this at age 57, when routine screening begins. A direct exam of the colon should be repeated every 5 10 years through age 47, unless early forms of pre-cancerous polyps or small growths are found.  People who are at an increased risk for hepatitis B should be screened for this virus. You are considered at high risk for hepatitis B if:  You were born in a country where hepatitis B occurs often. Talk with your health care provider about which countries are considered high-risk.  Your parents were born in a high-risk country and you have not received a shot to protect against hepatitis B (hepatitis B vaccine).  You have HIV or AIDS.  You use needles to inject street drugs.  You live with, or have sex with, someone who has hepatitis B.  You are a man who has sex with other men (MSM).  You get hemodialysis treatment.  You take certain medicines for conditions like cancer, organ transplantation, and autoimmune conditions.  Hepatitis C blood testing is recommended for all people born from [birth year] through 1963 and any individual with known risk factors for hepatitis C.  Healthy men should no longer receive prostate-specific antigen (PSA) blood tests as part of routine cancer screening. Talk to your health care provider about prostate cancer screening.  Testicular cancer screening is not recommended for adolescents or adult males who have no symptoms. Screening includes self-exam, a health care provider exam, and other screening tests. Consult with your health care provider about any symptoms you have or any concerns you have about testicular cancer.  Practice safe sex. Use condoms and avoid high-risk sexual practices to reduce the spread of sexually transmitted infections  (STIs).  Use sunscreen. Apply sunscreen liberally and repeatedly throughout the day. You should seek shade when your shadow is shorter than you. Protect yourself by wearing long sleeves, pants, a wide-brimmed hat, and sunglasses year round, whenever you are outdoors.  Tell your health care provider of new moles or changes in moles, especially if there is a change in shape or color. Also tell your provider if a mole is larger than the size of a pencil eraser.  A one-time screening for abdominal aortic aneurysm (AAA) and surgical repair of large AAAs by ultrasound is recommended for men aged 73 75 years who are current or former smokers.  Stay current with your vaccines (immunizations). Document  Released: 06/13/2008 Document Revised: 10/06/2013 Document Reviewed: 05/13/2011 [facility] Patient Information 2014 [place], [place].

## 2014-05-26 NOTE — Progress Notes (Signed)
Subjective:    Patient ID: Ricky Soto, male    DOB: Jun 27, 1971, 43 y.o.   MRN: [id number]  Foot Injury  Incident onset: one month ago. The incident occurred at the gym. The injury mechanism was an inversion injury. The pain is present in the right foot. The quality of the pain is described as stabbing. The pain is at a severity of 2/10. The pain is mild. The pain has been intermittent since onset. Pertinent negatives include no inability to bear weight, loss of motion, loss of sensation, muscle weakness, numbness or tingling. He reports no foreign bodies present. The symptoms are aggravated by weight bearing. He has tried NSAIDs for the symptoms. The treatment provided moderate relief.      Review of Systems  Constitutional: Negative.  Negative for fever, chills, diaphoresis, appetite change and fatigue.  HENT: Negative.   Eyes: Negative.   Respiratory: Negative.  Negative for cough, choking, chest tightness, shortness of breath and stridor.   Cardiovascular: Negative.  Negative for chest pain, palpitations and leg swelling.  Gastrointestinal: Negative.  Negative for nausea, vomiting, abdominal pain, diarrhea, constipation and blood in stool.  Endocrine: Negative.   Genitourinary: Negative.   Musculoskeletal: Negative.  Negative for back pain, gait problem, joint swelling and myalgias.  Skin: Negative.   Allergic/Immunologic: Negative.   Neurological: Negative.  Negative for dizziness, tingling, tremors, syncope, light-headedness and numbness.  Hematological: Negative.  Negative for adenopathy. Does not bruise/bleed easily.  Psychiatric/Behavioral: Negative.        Objective:   Physical Exam  Vitals reviewed. Constitutional: He is oriented to person, place, and time. He appears well-developed and well-nourished. No distress.  HENT:  Head: Normocephalic and atraumatic.  Mouth/Throat: Oropharynx is clear and moist. No oropharyngeal exudate.  Eyes: Conjunctivae are normal.  Right eye exhibits no discharge. Left eye exhibits no discharge. No scleral icterus.  Neck: Normal range of motion. Neck supple. No JVD present. No tracheal deviation present. No thyromegaly present.  Cardiovascular: Normal rate, regular rhythm, normal heart sounds and intact distal pulses.  Exam reveals no gallop and no friction rub.   No murmur heard. Pulmonary/Chest: Effort normal and breath sounds normal. No stridor. No respiratory distress. He has no wheezes. He has no rales. He exhibits no tenderness.  Abdominal: Soft. Bowel sounds are normal. He exhibits no distension and no mass. There is no tenderness. There is no rebound and no guarding. Hernia confirmed negative in the right inguinal area and confirmed negative in the left inguinal area.  Genitourinary: Prostate normal, testes normal and penis normal. Rectal exam shows internal hemorrhoid. Rectal exam shows no external hemorrhoid, no fissure, no mass, no tenderness and anal tone normal. Guaiac negative stool. Prostate is not enlarged and not tender. Right testis shows no mass, no swelling and no tenderness. Right testis is descended. Left testis shows no mass, no swelling and no tenderness. Left testis is descended. Circumcised. No penile erythema or penile tenderness. No discharge found.  Musculoskeletal: Normal range of motion. He exhibits no edema and no tenderness.       Right foot: Normal. He exhibits normal range of motion, no tenderness, no bony tenderness, no swelling, normal capillary refill, no crepitus, no deformity and no laceration.  Lymphadenopathy:    He has no cervical adenopathy.       Right: No inguinal adenopathy present.       Left: No inguinal adenopathy present.  Neurological: He is oriented to person, place, and time.  Skin: Skin is  warm and dry. No rash noted. He is not diaphoretic. No erythema. No pallor.  Psychiatric: He has a normal mood and affect. His behavior is normal. Judgment and thought content normal.      Lab Results  Component Value Date   WBC 7.0 03/18/2013   HGB 13.9 03/18/2013   HCT 40.9 03/18/2013   PLT 231.0 03/18/2013   GLUCOSE 96 03/18/2013   CHOL 198 03/18/2013   TRIG 53.0 03/18/2013   HDL 36.00* 03/18/2013   LDLDIRECT 171.6 12/11/2011   LDLCALC 151* 03/18/2013   ALT 26 03/18/2013   AST 25 03/18/2013   NA 139 03/18/2013   K 4.4 03/18/2013   CL 102 03/18/2013   CREATININE 1.2 03/18/2013   BUN 10 03/18/2013   CO2 30 03/18/2013   TSH 0.54 03/18/2013       Assessment & Plan:

## 2014-05-26 NOTE — Assessment & Plan Note (Signed)
Exam and plain film are normal Will follow for now

## 2014-06-13 ENCOUNTER — Emergency Department (HOSPITAL_BASED_OUTPATIENT_CLINIC_OR_DEPARTMENT_OTHER)
Admission: EM | Admit: 2014-06-13 | Discharge: 2014-06-13 | Disposition: A | Discharge status: Home / Self Care | Payer: BC Managed Care – PPO | Attending: Emergency Medicine | Admitting: Emergency Medicine

## 2014-06-13 ENCOUNTER — Encounter (HOSPITAL_BASED_OUTPATIENT_CLINIC_OR_DEPARTMENT_OTHER): Payer: Self-pay | Admitting: Emergency Medicine

## 2014-06-13 DIAGNOSIS — Z0000 Encounter for general adult medical examination without abnormal findings: Secondary | ICD-10-CM

## 2014-06-13 DIAGNOSIS — G40909 Epilepsy, unspecified, not intractable, without status epilepticus: Secondary | ICD-10-CM | POA: Insufficient documentation

## 2014-06-13 DIAGNOSIS — Y9389 Activity, other specified: Secondary | ICD-10-CM | POA: Insufficient documentation

## 2014-06-13 DIAGNOSIS — Z043 Encounter for examination and observation following other accident: Secondary | ICD-10-CM | POA: Insufficient documentation

## 2014-06-13 DIAGNOSIS — Z79899 Other long term (current) drug therapy: Secondary | ICD-10-CM | POA: Insufficient documentation

## 2014-06-13 DIAGNOSIS — Z872 Personal history of diseases of the skin and subcutaneous tissue: Secondary | ICD-10-CM | POA: Insufficient documentation

## 2014-06-13 DIAGNOSIS — Z8619 Personal history of other infectious and parasitic diseases: Secondary | ICD-10-CM | POA: Insufficient documentation

## 2014-06-13 DIAGNOSIS — Z711 Person with feared health complaint in whom no diagnosis is made: Secondary | ICD-10-CM | POA: Insufficient documentation

## 2014-06-13 DIAGNOSIS — Z880 Allergy status to penicillin: Secondary | ICD-10-CM | POA: Insufficient documentation

## 2014-06-13 DIAGNOSIS — Y92410 Unspecified street and highway as the place of occurrence of the external cause: Secondary | ICD-10-CM | POA: Insufficient documentation
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
## 2014-06-13 NOTE — ED Provider Notes (Signed)
Medical screening examination/treatment/procedure(s) were performed by non-physician practitioner and as supervising physician I was immediately available for consultation/collaboration.   EKG Interpretation None        Courtney F Horton, MD 06/13/14 1841 
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
## 2014-09-29 ENCOUNTER — Encounter (INDEPENDENT_AMBULATORY_CARE_PROVIDER_SITE_OTHER): Payer: Self-pay

## 2014-09-29 ENCOUNTER — Encounter: Payer: Self-pay | Admitting: Nurse Practitioner

## 2014-09-29 ENCOUNTER — Ambulatory Visit (INDEPENDENT_AMBULATORY_CARE_PROVIDER_SITE_OTHER): Payer: BC Managed Care – PPO | Admitting: Nurse Practitioner

## 2014-09-29 VITALS — BP 147/91 | HR 63 | Ht 71.0 in | Wt 259.6 lb

## 2014-09-29 DIAGNOSIS — G40209 Localization-related (focal) (partial) symptomatic epilepsy and epileptic syndromes with complex partial seizures, not intractable, without status epilepticus: Secondary | ICD-10-CM

## 2014-09-29 MED ORDER — LEVETIRACETAM 500 MG PO TABS: 500.0000 mg | ORAL_TABLET | Freq: Two times a day (BID) | ORAL | Status: DC

## 2014-09-29 NOTE — Patient Instructions (Signed)
Continue Keppra at current dose, renewed for one year  Call for any seizure activity Followup yearly and when necessary

## 2014-09-29 NOTE — Progress Notes (Signed)
[facility]  PATIENT: Ricky Soto DOB: 01/22/1971   REASON FOR VISIT: Followup for seizure disorder   HISTORY OF PRESENT ILLNESS:Ricky Soto, 43 yr old black male returns today for followup. He was last seen 09/30/13. He has been followed in this office since 2000 for complex partial seizures. He switched to generic Keppra two years ago  without difficulty. His last known seizure event was in 2004. Today he denies any interval seizures. No new medical problems. He denies any side effects of the Keppra.   REVIEW OF SYSTEMS: Full 14 system review of systems performed and notable only for those listed, all others are neg:  Constitutional: N/A  Cardiovascular: N/A  Ear/Nose/Throat: Runny nose  Skin: N/A  Eyes: N/A  Respiratory: N/A  Gastroitestinal: N/A  Hematology/Lymphatic: N/A  Endocrine: N/A Musculoskeletal:N/A  Allergy/Immunology: N/A  Neurological: N/A Psychiatric: N/A Sleep : Snoring, PCP has him scheduled for sleep study  ALLERGIES: Allergies  Allergen Reactions  . Penicillins     REACTION: childhood reaction- high fever    HOME MEDICATIONS: Outpatient Prescriptions Prior to Visit  Medication Sig Dispense Refill  . acyclovir (ZOVIRAX) 800 MG tablet TAKE 1 TABLET TWICE A DAY  60 tablet  4  . levETIRAcetam (KEPPRA) 500 MG tablet Take 1 tablet (500 mg total) by mouth every 12 (twelve) hours.  60 tablet  11   No facility-administered medications prior to visit.    PAST MEDICAL HISTORY: Past Medical History  Diagnosis Date  . Epilepsy   . Allergy   . Herpes   . Lichenoid dermatitis     perianal    PAST SURGICAL HISTORY: Past Surgical History  Procedure Laterality Date  . Cystectomy    . Tonsillectomy      FAMILY HISTORY: Family History  Problem Relation Age of Onset  . Hypertension Mother   . Hyperlipidemia Paternal Grandfather   . Cancer Neg Hx   . Heart disease Neg Hx   . Diabetes Neg Hx     SOCIAL HISTORY: History    Social History  . Marital Status: Married    Spouse Name: N/A    Number of Children: 1  . Years of Education: [employer]   Occupational History  .     Social History Main Topics  . Smoking status: Never Smoker   . Smokeless tobacco: Never Used  . Alcohol Use: No  . Drug Use: No  . Sexual Activity: Yes   Other Topics Concern  . Not on file   Social History Narrative   Patient lives at home with Claudie FishermanKentessa a son.   Patient graduated form [employer].    Patient has 1 child.      PHYSICAL EXAM  Filed Vitals:   09/29/14 0915  BP: 147/91  Pulse: 63  Height: 5\' 11"  (1.803 m)  Weight: 259 lb 9.6 oz (117.754 kg)   Body mass index is 36.22 kg/(m^2). Generalized: Well developed, obese male in no acute distress  Head: normocephalic and atraumatic,. Oropharynx benign  Neck: Supple, no carotid bruits  Musculoskeletal: No deformity  Neurological examination  Mentation: Alert oriented to time, place, history taking. Follows all commands speech and language fluent  Cranial nerve II-XII: Pupils were equal round reactive to light extraocular movements were full, visual field were full on confrontational test. Facial sensation and strength were normal. hearing was intact to finger rubbing bilaterally. Uvula tongue midline. head turning and shoulder shrug and were normal and symmetric.Tongue protrusion into cheek strength was normal.  Motor:  normal bulk and tone, full strength in the BUE, BLE, fine finger movements normal, no pronator drift. No focal weakness  Coordination: finger-nose-finger, heel-to-shin bilaterally, no dysmetria  Reflexes: 1+ upper lower and symmetric  Gait and Station: Rising up from seated position without assistance, normal stance, moderate stride, good arm swing, smooth turning, able to perform tiptoe, and heel walking without difficulty. Tandem normal   DIAGNOSTIC DATA (LABS, IMAGING, TESTING) - I reviewed patient records, labs, notes, testing and imaging myself where  available.  Lab Results  Component Value Date   WBC 5.9 05/26/2014   HGB 14.2 05/26/2014   HCT 42.8 05/26/2014   MCV 91.7 05/26/2014   PLT 202.0 05/26/2014      Component Value Date/Time   NA 141 05/26/2014 0913   K 4.1 05/26/2014 0913   CL 103 05/26/2014 0913   CO2 31 05/26/2014 0913   GLUCOSE 85 05/26/2014 0913   BUN 14 05/26/2014 0913   CREATININE 1.6* 05/26/2014 0913   CALCIUM 9.3 05/26/2014 0913   PROT 6.9 05/26/2014 0913   ALBUMIN 4.2 05/26/2014 0913   AST 31 05/26/2014 0913   ALT 31 05/26/2014 0913   ALKPHOS 29* 05/26/2014 0913   BILITOT 0.9 05/26/2014 0913   GFRNONAA 68.12 12/12/2010 0822   Lab Results  Component Value Date   CHOL 210* 05/26/2014   HDL 56.70 05/26/2014   LDLCALC 142* 05/26/2014   LDLDIRECT 171.6 12/11/2011   TRIG 57.0 05/26/2014   CHOLHDL 4 05/26/2014    Lab Results  Component Value Date   TSH 0.86 05/26/2014      ASSESSMENT AND PLAN  43 y.o. year old male  has a past medical history of Epilepsy; here to followup. Last seizure occurred in 2004  Continue Keppra at current dose, renewed for one year Call for any seizure activity Followup yearly and when necessary Nilda Riggs, [facility], [place], APRN  [facility] [address], [address] [place], [place] [address] [phone]

## 2014-10-21 ENCOUNTER — Other Ambulatory Visit: Payer: Self-pay | Admitting: Internal Medicine

## 2015-04-11 ENCOUNTER — Telehealth: Payer: Self-pay | Admitting: Nurse Practitioner

## 2015-04-11 ENCOUNTER — Telehealth: Payer: Self-pay | Admitting: Internal Medicine

## 2015-04-11 MED ORDER — LEVETIRACETAM 500 MG PO TABS: 500.0000 mg | ORAL_TABLET | Freq: Two times a day (BID) | ORAL | Status: DC

## 2015-04-11 MED ORDER — ACYCLOVIR 800 MG PO TABS: 800.0000 mg | ORAL_TABLET | Freq: Two times a day (BID) | ORAL | Status: AC

## 2015-04-11 NOTE — Telephone Encounter (Signed)
Patient is calling to get refill for Rx levetiracetam 50.  He is changing the way he receives his drugs to [facility] and wants a 90 day supply.  Please call.

## 2015-04-11 NOTE — Telephone Encounter (Signed)
Patient's main pharmacy is now [facility] and his refills will need to be 90 days. He does need refill for acyclovir (ZOVIRAX) 800 MG tablet [[id number].

## 2015-04-11 NOTE — Telephone Encounter (Signed)
Approved and sent e-script per pt request.

## 2015-04-13 ENCOUNTER — Ambulatory Visit: Payer: Self-pay | Admitting: Nurse Practitioner

## 2015-10-24 IMAGING — CR DG FOOT COMPLETE 3+V*R*
3 series · 3 of 3 positions shown · non-contrast
Comparison: April 14, 2009

CLINICAL DATA: Foot pain

EXAM:
RIGHT FOOT COMPLETE - 3+ VIEW

[view not recorded (1 of 3)]
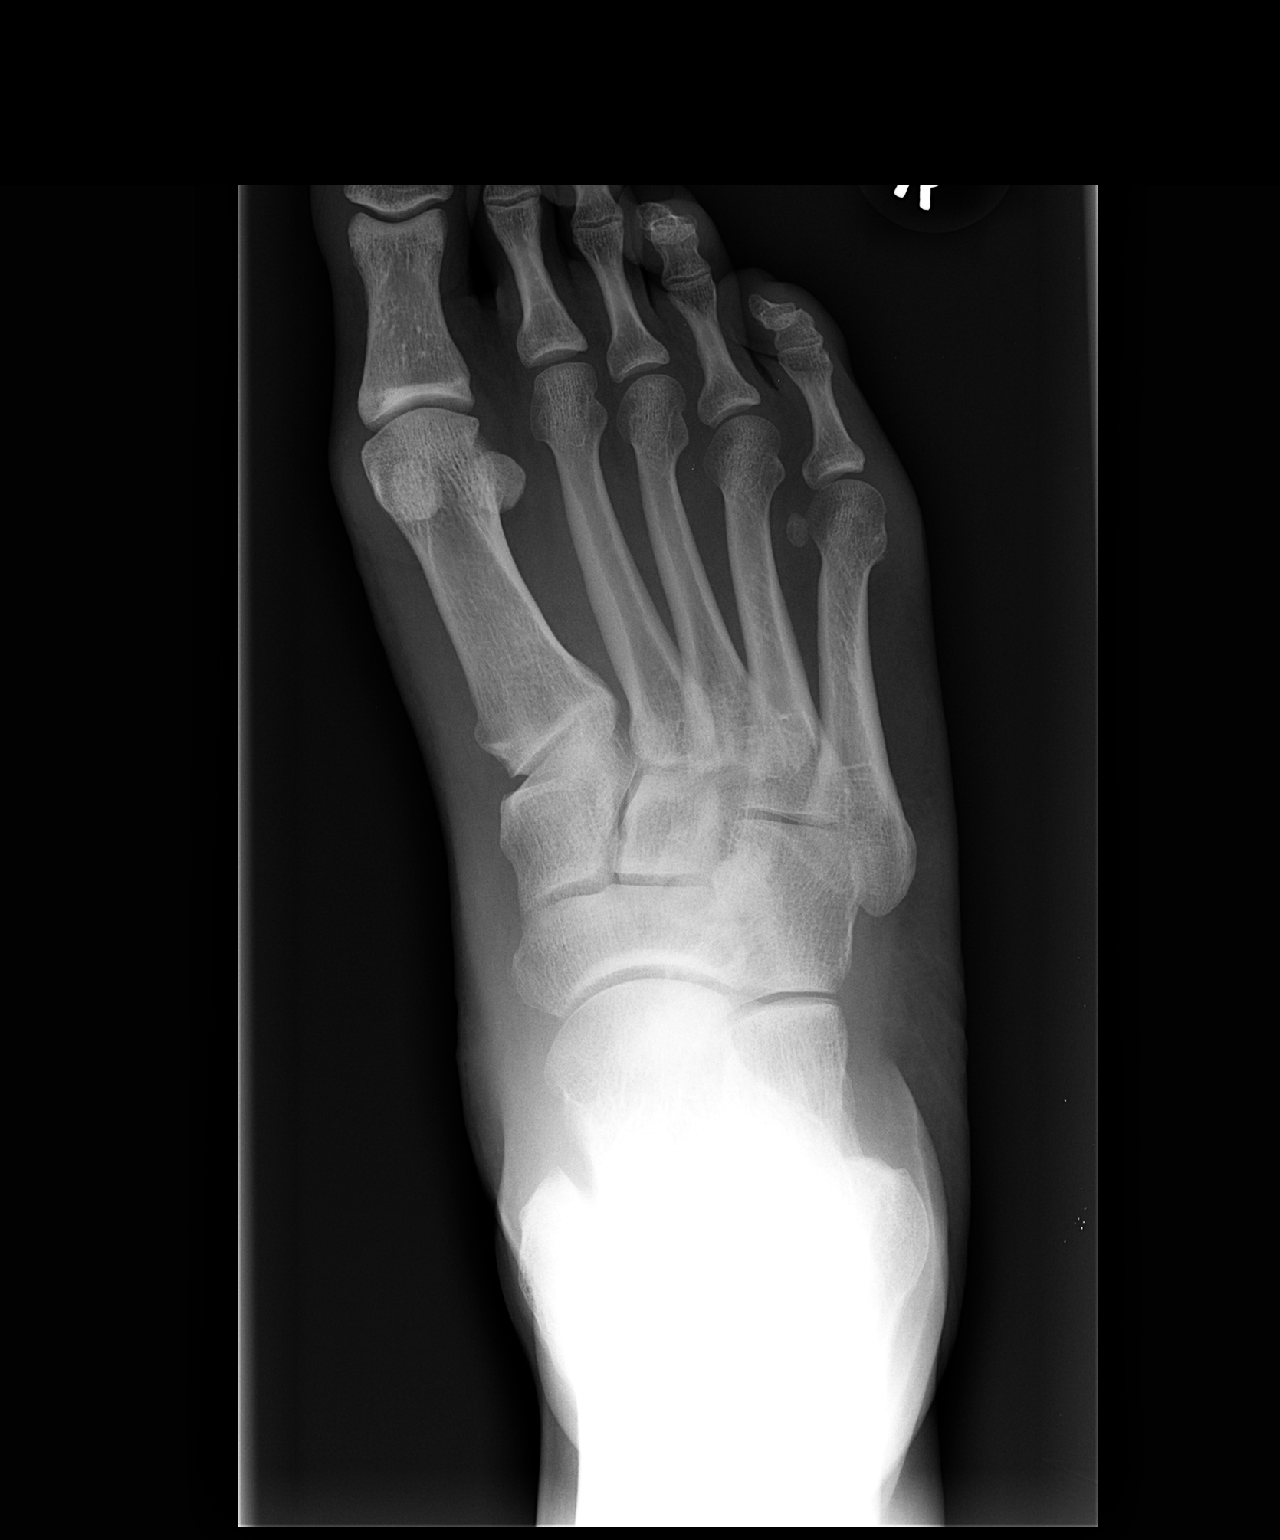

[view not recorded (2 of 3)]
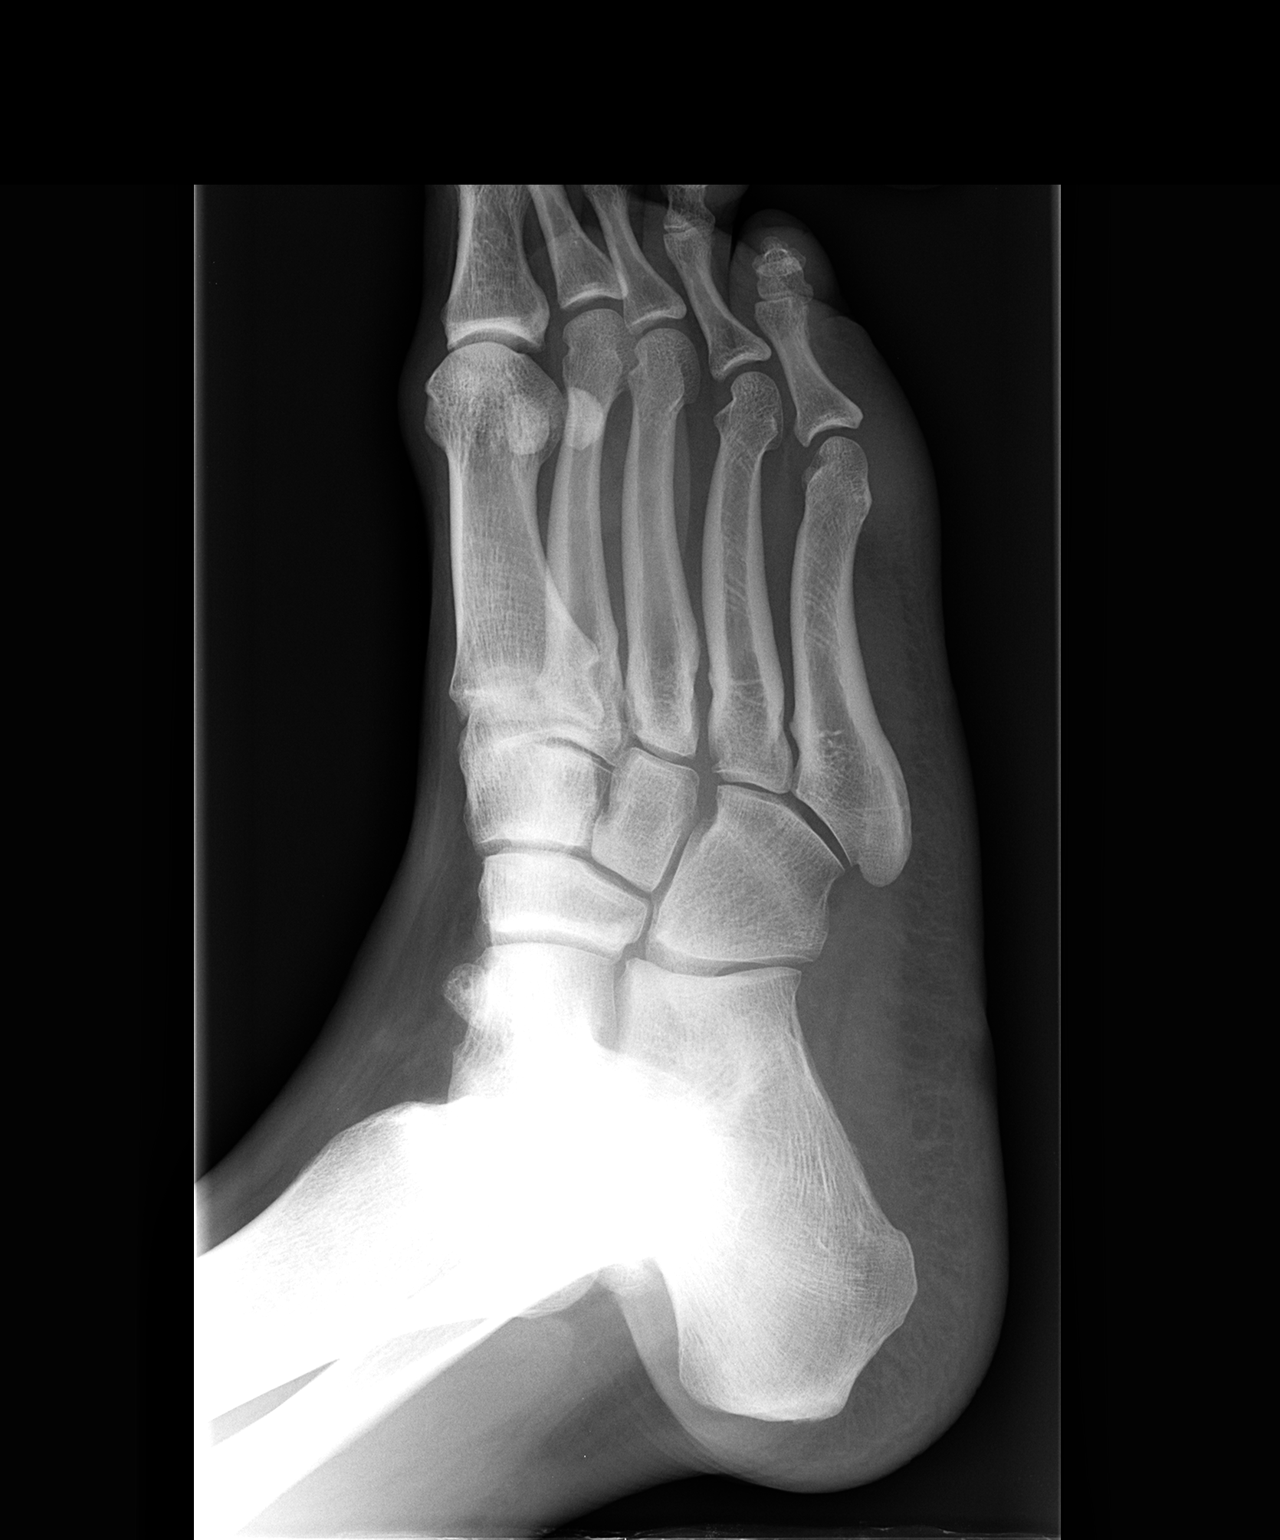

[view not recorded (3 of 3)]
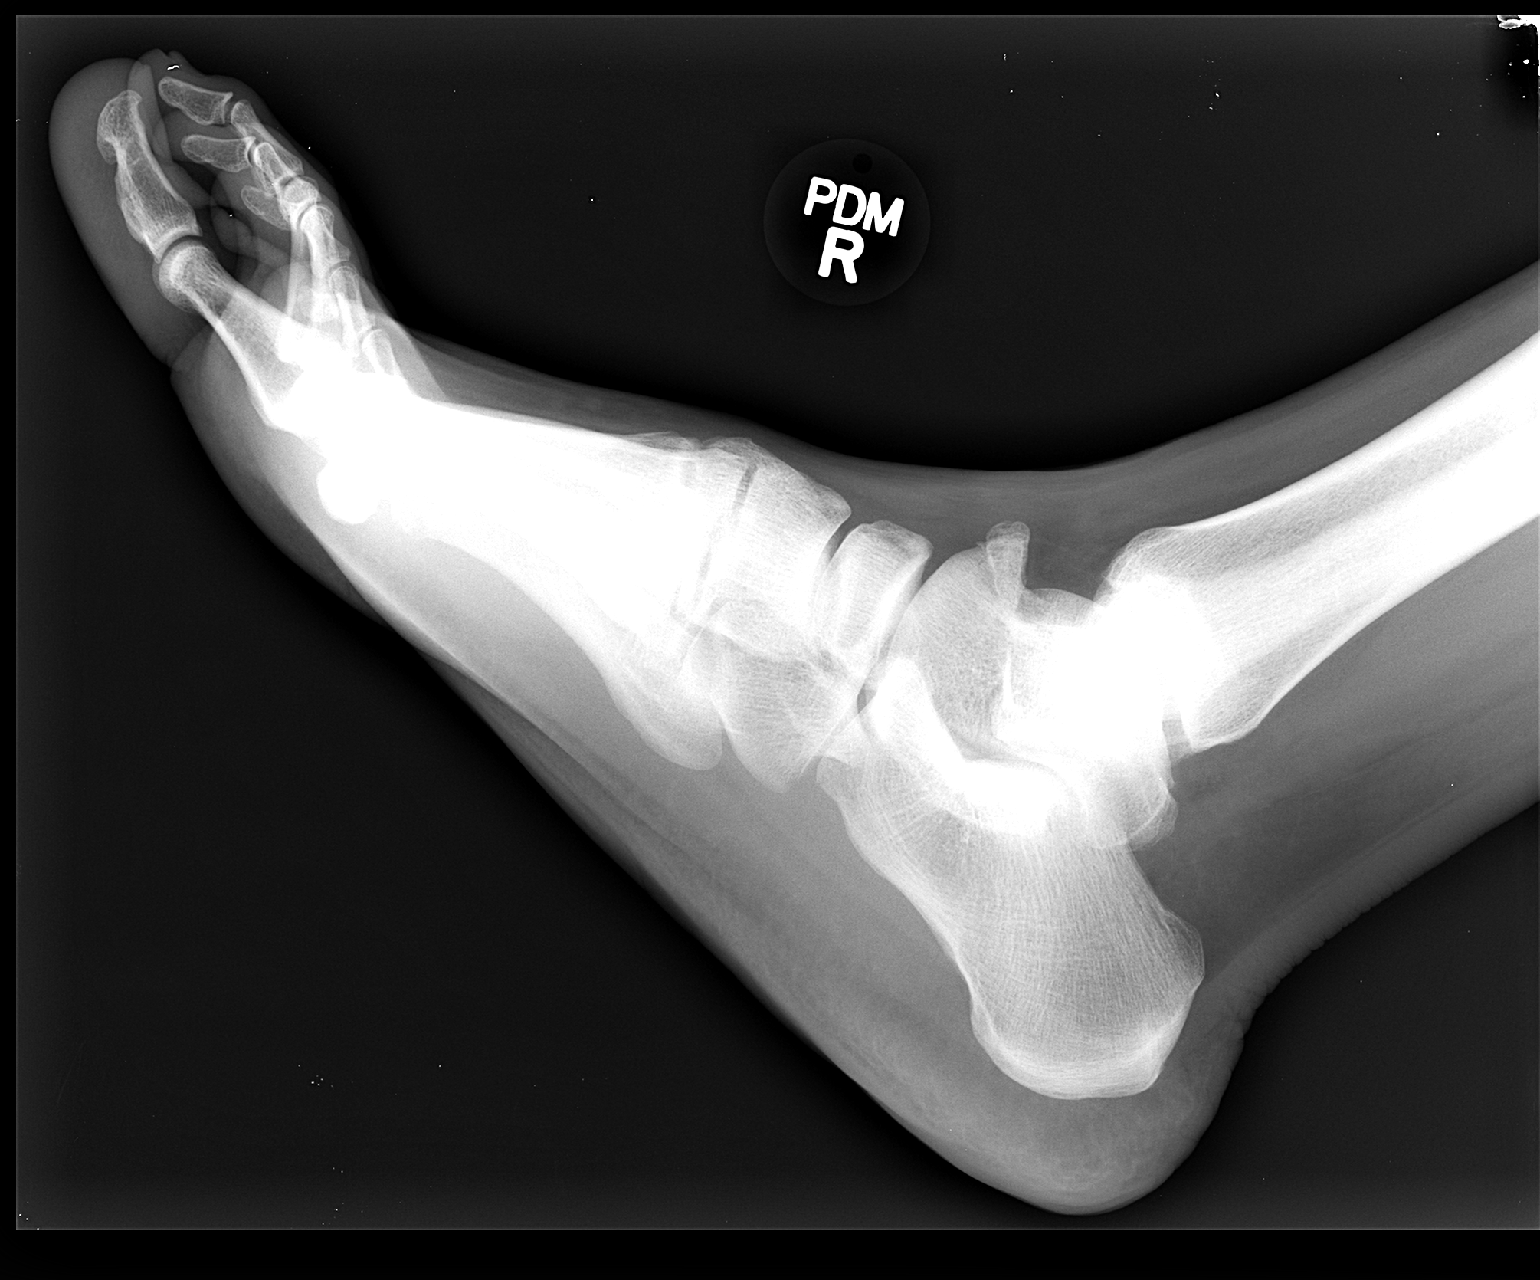

[3 of 3 positions shown; findings below may reference images not displayed]

FINDINGS: Frontal, oblique, and lateral views were obtained. There is no
fracture or dislocation. Joint spaces appear intact. There is a
benign exostosis arising from the dorsal distal talus, a stable
finding. There is pes planus.
IMPRESSION: No fracture or dislocation. No appreciable arthropathic change.
Stable exostosis arising from the dorsal distal talus. Pes planus.

## 2015-12-18 ENCOUNTER — Other Ambulatory Visit: Payer: Self-pay | Admitting: Internal Medicine

## 2015-12-18 ENCOUNTER — Other Ambulatory Visit: Payer: Self-pay | Admitting: Nurse Practitioner

## 2016-01-24 ENCOUNTER — Other Ambulatory Visit: Payer: Self-pay | Admitting: Nurse Practitioner

## 2016-01-24 NOTE — Telephone Encounter (Signed)
I called home/cell # for pt.  Could not LM (mailbox full).  We have not seen this pt since 2015.  Needs appt.  Last note states pt moving out of state.

## 2016-02-01 ENCOUNTER — Telehealth: Payer: Self-pay | Admitting: Internal Medicine

## 2016-02-01 ENCOUNTER — Telehealth: Payer: Self-pay | Admitting: Nurse Practitioner

## 2016-02-01 MED ORDER — LEVETIRACETAM 500 MG PO TABS: 500.0000 mg | ORAL_TABLET | Freq: Two times a day (BID) | ORAL | Status: AC

## 2016-02-01 NOTE — Telephone Encounter (Signed)
Pt has not been seen since 2015. Please advise

## 2016-02-01 NOTE — Telephone Encounter (Signed)
Patient called to request refills of levETIRAcetam (KEPPRA) 500 MG tablet

## 2016-02-01 NOTE — Telephone Encounter (Signed)
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 Requests refill of KEPPRA in the meantime.

## 2016-02-01 NOTE — Telephone Encounter (Signed)
I called the patient back.  Got no answer.  Mailbox was full, unable to leave message.

## 2016-02-01 NOTE — Telephone Encounter (Signed)
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  He needs to know if she can get a couple of month refills on his meds till he can get back to [place]?    [place] [address] phone number [phone] [facility] [address]

## 2016-02-01 NOTE — Telephone Encounter (Signed)
Patient was last seen in Oct 2015.  He moved to [place], and no PCP has been established.  He is requesting refills on Keppra.  Since it has been greater than one year, unfortunately, NP is unable to address, forwarding to MD for review.  Please advise.  Thank you.

## 2016-02-01 NOTE — Telephone Encounter (Signed)
I called again. Got no answer. Mailbox was full, unable to leave message.

## 2016-02-01 NOTE — Telephone Encounter (Signed)
Please let patient know, I have refilled his Keppra 500 mg twice a day for 1 year, he should find a local neurologists soon.

## 2016-02-02 NOTE — Telephone Encounter (Signed)
Called patient again.  Got no answer.  Was unable to leave message, as mailbox is full.

## 2016-02-02 NOTE — Telephone Encounter (Signed)
I called again.  Got no answer, mailbox was full.  Unable to leave message.

## 2016-02-05 NOTE — Telephone Encounter (Signed)
Called the patient again.  Got no answer.  Mailbox is still full, unable to leave message.

## 2016-02-06 NOTE — Telephone Encounter (Signed)
Called patient.  Got no answer, VM was full, unable to leave message.

## 2016-02-07 NOTE — Telephone Encounter (Signed)
Called patient again.  Got no answer.  Was not able to leave message due to voicemail being full.  We have made numerous attempts to reach this patient to no avail.  If patient calls back, please kindly relay providers message below.  Thank you!

## 2016-02-07 NOTE — Telephone Encounter (Signed)
Called patient again. Got no answer. Was not able to leave message, mailbox is full.

## 2017-02-13 ENCOUNTER — Other Ambulatory Visit: Payer: Self-pay | Admitting: Neurology
